# Patient Record
Sex: Female | Born: 1980 | Race: White | Hispanic: No | Marital: Married | Smoking: Former smoker
Health system: Southern US, Community
[De-identification: ages and names within clinical notes are randomized; demographics above are authoritative.]

## PROBLEM LIST (undated history)

## (undated) DIAGNOSIS — D509 Iron deficiency anemia, unspecified: Secondary | ICD-10-CM

## (undated) DIAGNOSIS — M7500 Adhesive capsulitis of unspecified shoulder: Secondary | ICD-10-CM

## (undated) DIAGNOSIS — J45909 Unspecified asthma, uncomplicated: Secondary | ICD-10-CM

## (undated) DIAGNOSIS — Z8669 Personal history of other diseases of the nervous system and sense organs: Secondary | ICD-10-CM

## (undated) DIAGNOSIS — M7540 Impingement syndrome of unspecified shoulder: Secondary | ICD-10-CM

## (undated) DIAGNOSIS — M25511 Pain in right shoulder: Secondary | ICD-10-CM

## (undated) DIAGNOSIS — T7840XA Allergy, unspecified, initial encounter: Secondary | ICD-10-CM

## (undated) DIAGNOSIS — O4590 Premature separation of placenta, unspecified, unspecified trimester: Secondary | ICD-10-CM

## (undated) DIAGNOSIS — Z789 Other specified health status: Secondary | ICD-10-CM

## (undated) DIAGNOSIS — M84353A Stress fracture, unspecified femur, initial encounter for fracture: Secondary | ICD-10-CM

## (undated) DIAGNOSIS — G540 Brachial plexus disorders: Secondary | ICD-10-CM

## (undated) DIAGNOSIS — N200 Calculus of kidney: Secondary | ICD-10-CM

## (undated) DIAGNOSIS — R011 Cardiac murmur, unspecified: Secondary | ICD-10-CM

## (undated) HISTORY — DX: Unspecified asthma, uncomplicated: J45.909

## (undated) HISTORY — DX: Allergy, unspecified, initial encounter: T78.40XA

## (undated) HISTORY — DX: Other specified health status: Z78.9

## (undated) HISTORY — DX: Brachial plexus disorders: G54.0

## (undated) HISTORY — DX: Iron deficiency anemia, unspecified: D50.9

## (undated) HISTORY — DX: Pain in right shoulder: M25.511

## (undated) HISTORY — DX: Calculus of kidney: N20.0

## (undated) HISTORY — DX: Impingement syndrome of unspecified shoulder: M75.40

## (undated) HISTORY — DX: Stress fracture, unspecified femur, initial encounter for fracture: M84.353A

## (undated) HISTORY — PX: URETERAL STENT PLACEMENT: SHX822

## (undated) HISTORY — DX: Personal history of other diseases of the nervous system and sense organs: Z86.69

## (undated) HISTORY — DX: Premature separation of placenta, unspecified, unspecified trimester: O45.90

## (undated) HISTORY — DX: Cardiac murmur, unspecified: R01.1

## (undated) HISTORY — DX: Adhesive capsulitis of unspecified shoulder: M75.00

## (undated) HISTORY — DX: Hypercalcemia: E83.52

## (undated) HISTORY — PX: SHOULDER ARTHROSCOPY: SHX128

## (undated) HISTORY — PX: NEPHROSTOMY: SHX1014

---

## 2005-02-08 ENCOUNTER — Ambulatory Visit (HOSPITAL_COMMUNITY): Admission: RE | Admit: 2005-02-08 | Discharge: 2005-02-08 | Payer: Self-pay | Admitting: Obstetrics and Gynecology

## 2005-04-18 ENCOUNTER — Emergency Department (HOSPITAL_COMMUNITY): Admission: EM | Admit: 2005-04-18 | Discharge: 2005-04-18 | Payer: Self-pay | Admitting: Emergency Medicine

## 2005-07-18 ENCOUNTER — Ambulatory Visit: Payer: Self-pay | Admitting: Family Medicine

## 2005-07-29 ENCOUNTER — Other Ambulatory Visit: Admission: RE | Admit: 2005-07-29 | Discharge: 2005-07-29 | Payer: Self-pay | Admitting: Obstetrics and Gynecology

## 2005-07-30 ENCOUNTER — Encounter (INDEPENDENT_AMBULATORY_CARE_PROVIDER_SITE_OTHER): Payer: Self-pay | Admitting: Internal Medicine

## 2005-08-05 ENCOUNTER — Ambulatory Visit (HOSPITAL_COMMUNITY): Admission: RE | Admit: 2005-08-05 | Discharge: 2005-08-05 | Payer: Self-pay | Admitting: Obstetrics and Gynecology

## 2005-08-26 ENCOUNTER — Inpatient Hospital Stay (HOSPITAL_COMMUNITY): Admission: AD | Admit: 2005-08-26 | Discharge: 2005-08-26 | Payer: Self-pay | Admitting: Obstetrics and Gynecology

## 2005-09-04 ENCOUNTER — Ambulatory Visit: Payer: Self-pay | Admitting: Family Medicine

## 2005-09-09 ENCOUNTER — Ambulatory Visit (HOSPITAL_COMMUNITY): Admission: RE | Admit: 2005-09-09 | Discharge: 2005-09-09 | Payer: Self-pay | Admitting: Obstetrics and Gynecology

## 2005-09-20 ENCOUNTER — Ambulatory Visit: Payer: Self-pay | Admitting: Family Medicine

## 2005-10-02 ENCOUNTER — Inpatient Hospital Stay (HOSPITAL_COMMUNITY): Admission: AD | Admit: 2005-10-02 | Discharge: 2005-10-05 | Payer: Self-pay | Admitting: Obstetrics and Gynecology

## 2005-10-11 ENCOUNTER — Ambulatory Visit: Payer: Self-pay | Admitting: Family Medicine

## 2005-10-19 ENCOUNTER — Inpatient Hospital Stay (HOSPITAL_COMMUNITY): Admission: AD | Admit: 2005-10-19 | Discharge: 2005-10-19 | Payer: Self-pay | Admitting: Obstetrics and Gynecology

## 2005-10-22 ENCOUNTER — Inpatient Hospital Stay (HOSPITAL_COMMUNITY): Admission: AD | Admit: 2005-10-22 | Discharge: 2005-10-24 | Payer: Self-pay | Admitting: Obstetrics and Gynecology

## 2005-10-22 ENCOUNTER — Ambulatory Visit (HOSPITAL_COMMUNITY): Admission: RE | Admit: 2005-10-22 | Discharge: 2005-10-22 | Payer: Self-pay | Admitting: *Deleted

## 2005-10-22 ENCOUNTER — Ambulatory Visit: Payer: Self-pay | Admitting: *Deleted

## 2005-10-31 ENCOUNTER — Encounter (HOSPITAL_COMMUNITY): Admission: AD | Admit: 2005-10-31 | Discharge: 2005-10-31 | Payer: Self-pay | Admitting: Obstetrics and Gynecology

## 2005-11-06 ENCOUNTER — Inpatient Hospital Stay (HOSPITAL_COMMUNITY): Admission: AD | Admit: 2005-11-06 | Discharge: 2005-11-06 | Payer: Self-pay | Admitting: Obstetrics and Gynecology

## 2005-11-06 ENCOUNTER — Ambulatory Visit: Payer: Self-pay | Admitting: Neonatology

## 2005-11-06 ENCOUNTER — Inpatient Hospital Stay (HOSPITAL_COMMUNITY): Admission: AD | Admit: 2005-11-06 | Discharge: 2005-11-15 | Payer: Self-pay | Admitting: Obstetrics and Gynecology

## 2005-11-06 ENCOUNTER — Ambulatory Visit: Payer: Self-pay | Admitting: *Deleted

## 2005-11-12 ENCOUNTER — Encounter (INDEPENDENT_AMBULATORY_CARE_PROVIDER_SITE_OTHER): Payer: Self-pay | Admitting: *Deleted

## 2005-11-16 ENCOUNTER — Encounter: Admission: RE | Admit: 2005-11-16 | Discharge: 2005-11-19 | Payer: Self-pay | Admitting: Obstetrics and Gynecology

## 2005-11-20 ENCOUNTER — Inpatient Hospital Stay (HOSPITAL_COMMUNITY): Admission: AD | Admit: 2005-11-20 | Discharge: 2005-11-20 | Payer: Self-pay | Admitting: Obstetrics and Gynecology

## 2005-11-21 ENCOUNTER — Inpatient Hospital Stay (HOSPITAL_COMMUNITY): Admission: AD | Admit: 2005-11-21 | Discharge: 2005-11-21 | Payer: Self-pay | Admitting: Obstetrics and Gynecology

## 2005-11-22 ENCOUNTER — Encounter (HOSPITAL_COMMUNITY): Admission: AD | Admit: 2005-11-22 | Discharge: 2005-12-22 | Payer: Self-pay | Admitting: Obstetrics and Gynecology

## 2005-12-12 ENCOUNTER — Ambulatory Visit: Payer: Self-pay | Admitting: Family Medicine

## 2006-01-01 ENCOUNTER — Ambulatory Visit: Payer: Self-pay | Admitting: Family Medicine

## 2006-02-19 ENCOUNTER — Ambulatory Visit: Payer: Self-pay | Admitting: Family Medicine

## 2006-02-27 ENCOUNTER — Ambulatory Visit: Payer: Self-pay | Admitting: Family Medicine

## 2006-03-22 ENCOUNTER — Emergency Department (HOSPITAL_COMMUNITY): Admission: EM | Admit: 2006-03-22 | Discharge: 2006-03-22 | Payer: Self-pay | Admitting: Emergency Medicine

## 2006-04-23 ENCOUNTER — Ambulatory Visit: Payer: Self-pay | Admitting: Internal Medicine

## 2006-04-25 ENCOUNTER — Encounter (INDEPENDENT_AMBULATORY_CARE_PROVIDER_SITE_OTHER): Payer: Self-pay | Admitting: Internal Medicine

## 2006-04-25 LAB — CONVERTED CEMR LAB
RBC count: 4.25 10*6/uL
WBC, blood: 5 10*3/uL

## 2006-06-11 ENCOUNTER — Ambulatory Visit: Payer: Self-pay | Admitting: Internal Medicine

## 2006-08-13 ENCOUNTER — Other Ambulatory Visit: Admission: RE | Admit: 2006-08-13 | Discharge: 2006-08-13 | Payer: Self-pay | Admitting: Obstetrics and Gynecology

## 2006-08-15 ENCOUNTER — Ambulatory Visit: Payer: Self-pay | Admitting: Internal Medicine

## 2006-08-18 ENCOUNTER — Inpatient Hospital Stay (HOSPITAL_COMMUNITY): Admission: AD | Admit: 2006-08-18 | Discharge: 2006-08-18 | Payer: Self-pay | Admitting: Obstetrics and Gynecology

## 2006-10-03 ENCOUNTER — Ambulatory Visit: Payer: Self-pay | Admitting: Family Medicine

## 2006-10-31 ENCOUNTER — Ambulatory Visit: Payer: Self-pay | Admitting: Family Medicine

## 2006-11-08 ENCOUNTER — Inpatient Hospital Stay (HOSPITAL_COMMUNITY): Admission: AD | Admit: 2006-11-08 | Discharge: 2006-11-08 | Payer: Self-pay | Admitting: Obstetrics and Gynecology

## 2006-12-03 ENCOUNTER — Inpatient Hospital Stay (HOSPITAL_COMMUNITY): Admission: AD | Admit: 2006-12-03 | Discharge: 2006-12-03 | Payer: Self-pay | Admitting: Obstetrics and Gynecology

## 2006-12-04 ENCOUNTER — Encounter: Payer: Self-pay | Admitting: Internal Medicine

## 2006-12-04 DIAGNOSIS — J45909 Unspecified asthma, uncomplicated: Secondary | ICD-10-CM | POA: Insufficient documentation

## 2006-12-04 DIAGNOSIS — E785 Hyperlipidemia, unspecified: Secondary | ICD-10-CM | POA: Insufficient documentation

## 2006-12-04 DIAGNOSIS — G43909 Migraine, unspecified, not intractable, without status migrainosus: Secondary | ICD-10-CM | POA: Insufficient documentation

## 2006-12-04 DIAGNOSIS — O4590 Premature separation of placenta, unspecified, unspecified trimester: Secondary | ICD-10-CM | POA: Insufficient documentation

## 2006-12-04 DIAGNOSIS — J309 Allergic rhinitis, unspecified: Secondary | ICD-10-CM | POA: Insufficient documentation

## 2006-12-04 DIAGNOSIS — D649 Anemia, unspecified: Secondary | ICD-10-CM | POA: Insufficient documentation

## 2006-12-10 ENCOUNTER — Ambulatory Visit (HOSPITAL_COMMUNITY): Admission: RE | Admit: 2006-12-10 | Discharge: 2006-12-10 | Payer: Self-pay | Admitting: Obstetrics and Gynecology

## 2006-12-10 ENCOUNTER — Encounter (INDEPENDENT_AMBULATORY_CARE_PROVIDER_SITE_OTHER): Payer: Self-pay | Admitting: Internal Medicine

## 2006-12-11 ENCOUNTER — Inpatient Hospital Stay (HOSPITAL_COMMUNITY): Admission: AD | Admit: 2006-12-11 | Discharge: 2006-12-11 | Payer: Self-pay | Admitting: Obstetrics and Gynecology

## 2006-12-12 ENCOUNTER — Ambulatory Visit: Payer: Self-pay | Admitting: Family Medicine

## 2006-12-19 ENCOUNTER — Ambulatory Visit (HOSPITAL_COMMUNITY): Admission: RE | Admit: 2006-12-19 | Discharge: 2006-12-19 | Payer: Self-pay | Admitting: Urology

## 2006-12-29 ENCOUNTER — Ambulatory Visit (HOSPITAL_COMMUNITY): Admission: RE | Admit: 2006-12-29 | Discharge: 2006-12-29 | Payer: Self-pay | Admitting: Interventional Radiology

## 2007-01-15 ENCOUNTER — Ambulatory Visit (HOSPITAL_COMMUNITY): Admission: RE | Admit: 2007-01-15 | Discharge: 2007-01-15 | Payer: Self-pay | Admitting: Urology

## 2007-01-16 ENCOUNTER — Inpatient Hospital Stay (HOSPITAL_COMMUNITY): Admission: AD | Admit: 2007-01-16 | Discharge: 2007-01-16 | Payer: Self-pay | Admitting: Obstetrics and Gynecology

## 2007-01-17 ENCOUNTER — Inpatient Hospital Stay (HOSPITAL_COMMUNITY): Admission: AD | Admit: 2007-01-17 | Discharge: 2007-01-17 | Payer: Self-pay | Admitting: Obstetrics and Gynecology

## 2007-01-18 ENCOUNTER — Inpatient Hospital Stay (HOSPITAL_COMMUNITY): Admission: AD | Admit: 2007-01-18 | Discharge: 2007-01-18 | Payer: Self-pay | Admitting: Obstetrics and Gynecology

## 2007-01-19 ENCOUNTER — Inpatient Hospital Stay (HOSPITAL_COMMUNITY): Admission: AD | Admit: 2007-01-19 | Discharge: 2007-01-19 | Payer: Self-pay | Admitting: Obstetrics and Gynecology

## 2007-01-20 ENCOUNTER — Inpatient Hospital Stay (HOSPITAL_COMMUNITY): Admission: AD | Admit: 2007-01-20 | Discharge: 2007-01-20 | Payer: Self-pay | Admitting: Obstetrics and Gynecology

## 2007-01-21 ENCOUNTER — Inpatient Hospital Stay (HOSPITAL_COMMUNITY): Admission: AD | Admit: 2007-01-21 | Discharge: 2007-01-21 | Payer: Self-pay | Admitting: Obstetrics and Gynecology

## 2007-01-27 ENCOUNTER — Encounter: Payer: Self-pay | Admitting: Obstetrics and Gynecology

## 2007-01-27 ENCOUNTER — Ambulatory Visit (HOSPITAL_COMMUNITY): Admission: AD | Admit: 2007-01-27 | Discharge: 2007-01-28 | Payer: Self-pay | Admitting: Urology

## 2007-02-06 ENCOUNTER — Inpatient Hospital Stay (HOSPITAL_COMMUNITY): Admission: AD | Admit: 2007-02-06 | Discharge: 2007-02-09 | Payer: Self-pay | Admitting: Obstetrics and Gynecology

## 2007-02-16 ENCOUNTER — Inpatient Hospital Stay (HOSPITAL_COMMUNITY): Admission: AD | Admit: 2007-02-16 | Discharge: 2007-02-16 | Payer: Self-pay | Admitting: Obstetrics and Gynecology

## 2007-02-20 ENCOUNTER — Inpatient Hospital Stay (HOSPITAL_COMMUNITY): Admission: AD | Admit: 2007-02-20 | Discharge: 2007-02-22 | Payer: Self-pay | Admitting: Obstetrics and Gynecology

## 2007-02-20 ENCOUNTER — Encounter (INDEPENDENT_AMBULATORY_CARE_PROVIDER_SITE_OTHER): Payer: Self-pay | Admitting: Specialist

## 2007-04-21 ENCOUNTER — Encounter: Admission: RE | Admit: 2007-04-21 | Discharge: 2007-07-20 | Payer: Self-pay | Admitting: Obstetrics and Gynecology

## 2007-04-22 ENCOUNTER — Telehealth (INDEPENDENT_AMBULATORY_CARE_PROVIDER_SITE_OTHER): Payer: Self-pay | Admitting: Family Medicine

## 2007-05-14 IMAGING — XA IR BILIARY CATHETER EXCHANGE
1 series · 5 of 5 positions shown · IV contrast (omnipaque)
Comparison: none

CLINICAL DATA: The patient is status post right percutaneous nephrostomy for ureteral obstruction due to a renal calculus.  She is now approximately 32 weeks pregnant, and the tube was originally placed on 12/19/06.  The 10-French nephrostomy tube had to be replaced on 12/29/06 due to tube occlusion from urinary sediment and debris.  The tube has now stopped draining sufficiently and is also difficult to flush.  The patient now presents for tube check with possible exchange. 
 RIGHT PERCUTANEOUS NEPHROSTOMY TUBE PLACEMENT WITH INJECTION OF CONTRAST UNDER FLUOROSCOPY ? 01/15/07:
 Contrast:  15 cc diluted Omnipaque 300. 
 Total fluoro time:  4.2 minutes.
 No sedation was administered.  Informed consent was obtained.  The entire pelvis was shielded during the procedure. 
 Initially, the preexisting 10-French nephrostomy tube was sterilely prepped and draped.  This was injected with contrast material and fluoroscopic spot images obtained.  The tube was then reprepped and sterilely draped.  Retention suture was cut and a guidewire advanced through the catheter after the external catheter was cut.  Ultimately, different guidewires were utilized in obtaining access out of the tube into the renal pelvis.  The preexisting tube was then removed over a guidewire.
 A new 12-French percutaneous nephrostomy tube was then advanced over a guidewire and formed.  This was then utilized in collecting a urine specimen for culture.  The tube was flushed and connected to a gravity bag.  The external tube was then secured to the skin with a Prolene retention suture and adhesive retention device. 
 Complications:  None.

[Series 1: run · 5 of 5 slices shown]
[im 1/5]
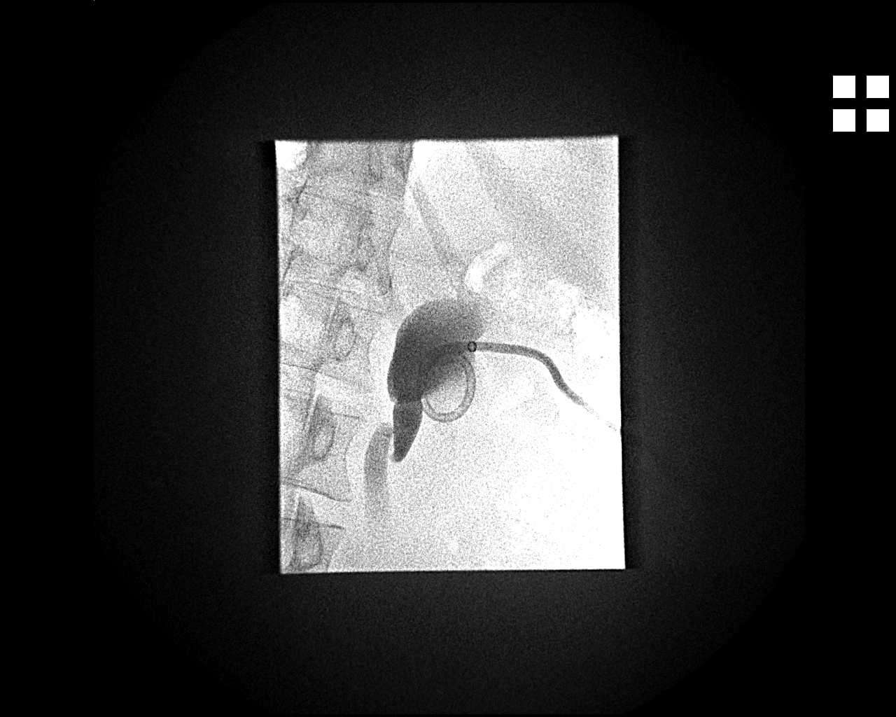
[im 2/5]
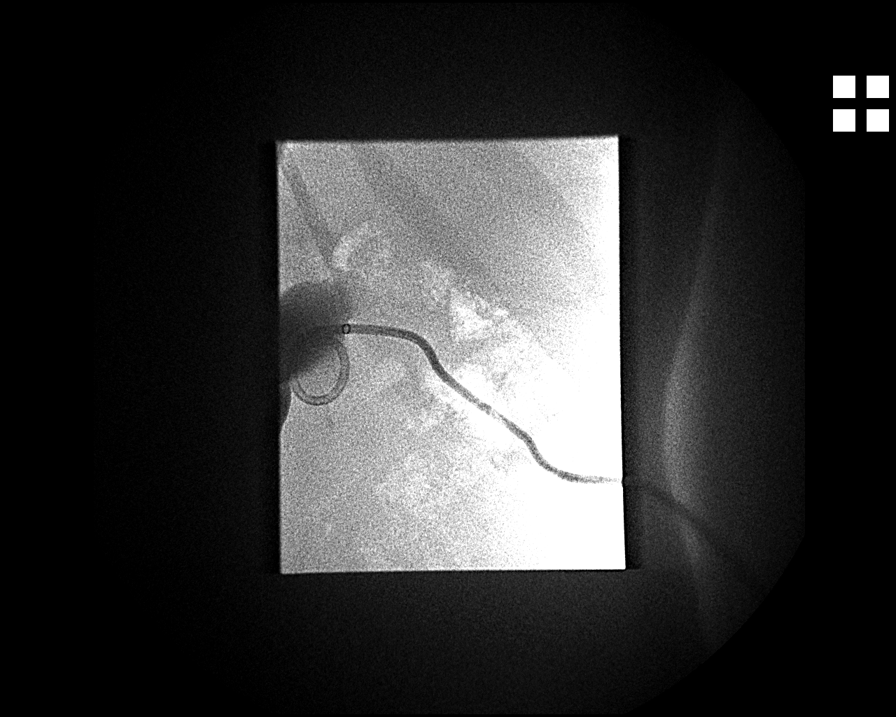
[im 3/5]
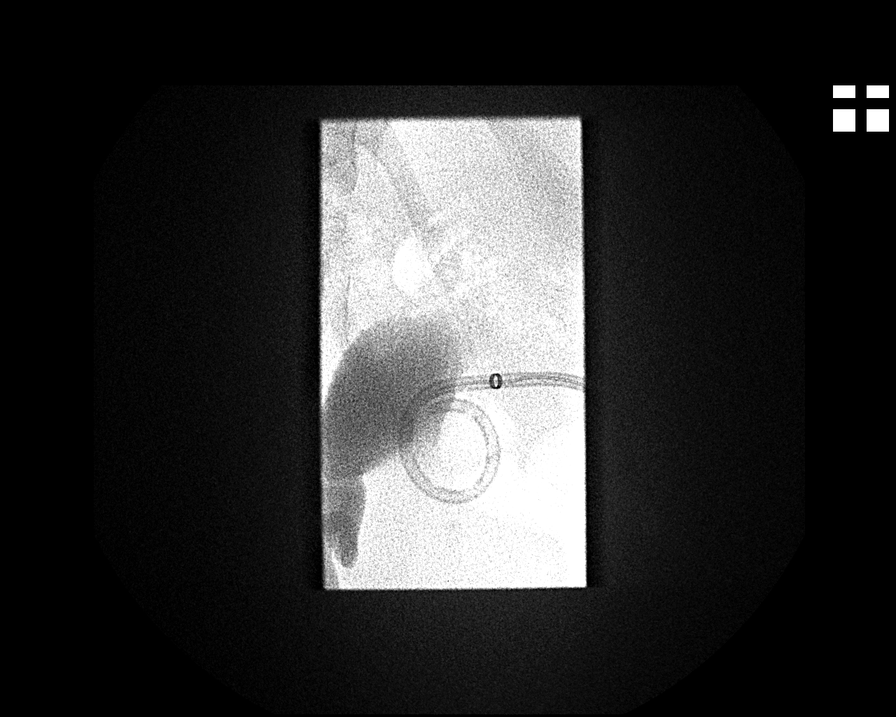
[im 4/5]
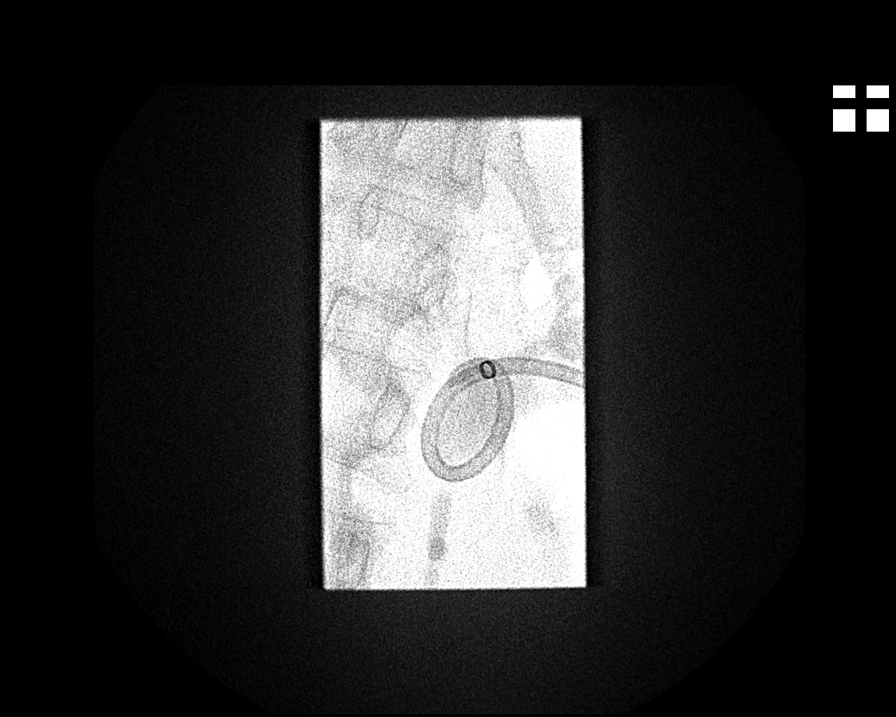
[im 5/5]
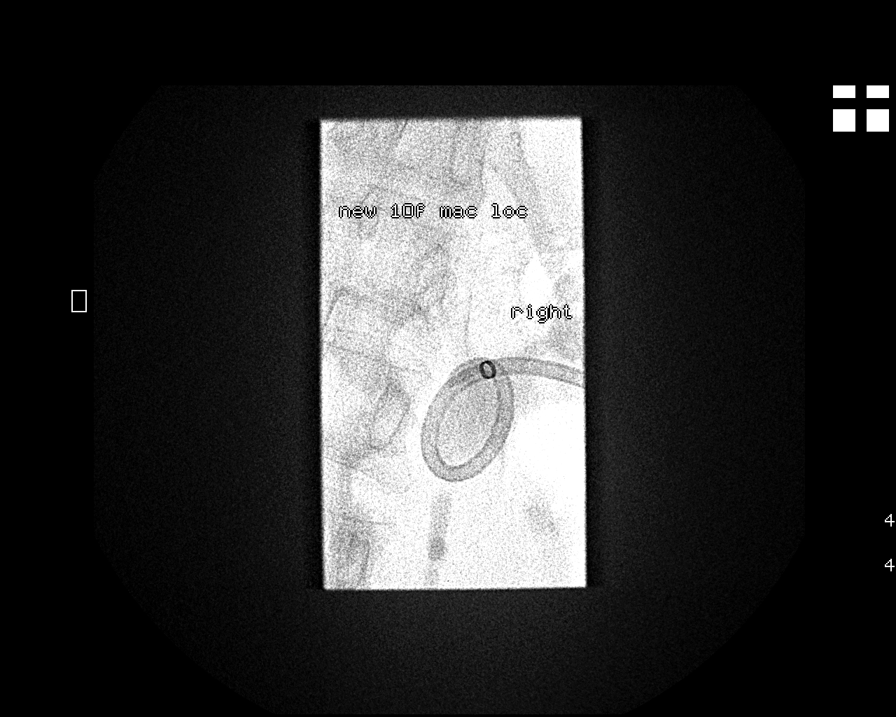

[5 of 5 positions shown; findings below may reference images not displayed]

FINDINGS: Initial injection of the right 10-French nephrostomy tube shows partial obstruction of most of the distal portion of the tube with contrast exiting one or a few proximal side holes and entering the renal pelvis.  When a guidewire was advanced into the catheter, a significant amount of resistance was met in the pigtail portion due to tube occlusion.  It was difficult in ultimately passing the guidewire out of one of the side holes and into the renal pelvis.  Ultimately, this was able to be accomplished and the tube removed over a wire, avoiding re-puncture of the kidney.  Due to the rapid development of tube occlusion and buildup of urinary sediment, the tube was upsized to a 12-French catheter.  This was formed at the level of the renal pelvis.  A sample of urine was sent for culture, as the patient has had some low-grade fevers at home over the last week.
IMPRESSION: Occlusion of most of the distal portion of the preexisting 10-French nephrostomy tube, which is well positioned in the central collecting system.  This is due to buildup of urinary sediment and debris in the catheter.  After a fairly difficult removal of the partially occluded tube over a guidewire, a new nephrostomy tube was placed.  Tube size was increased to 12-French, and the catheter was formed at the level of the renal pelvis.  This will be left to gravity drainage, and the patient will flush the catheter at least once daily at home.  As above, a sample of urine was sent for culture study as the patient has had low-grade fevers at home.  The urine itself was slightly blood-tinged upon completion of the procedure and not grossly infected in appearance.

## 2007-05-15 IMAGING — CR DG CHEST 2V
2 series · 2 of 2 positions shown · non-contrast
Comparison: none

CLINICAL DATA: 31 weeks estimated gestational age with recent placement of nephrostomy tube.  Chest pain increasing when supine.  Evaluate for pneumothorax.        
 CHEST - 2 VIEW:

[view not recorded (1 of 2)]
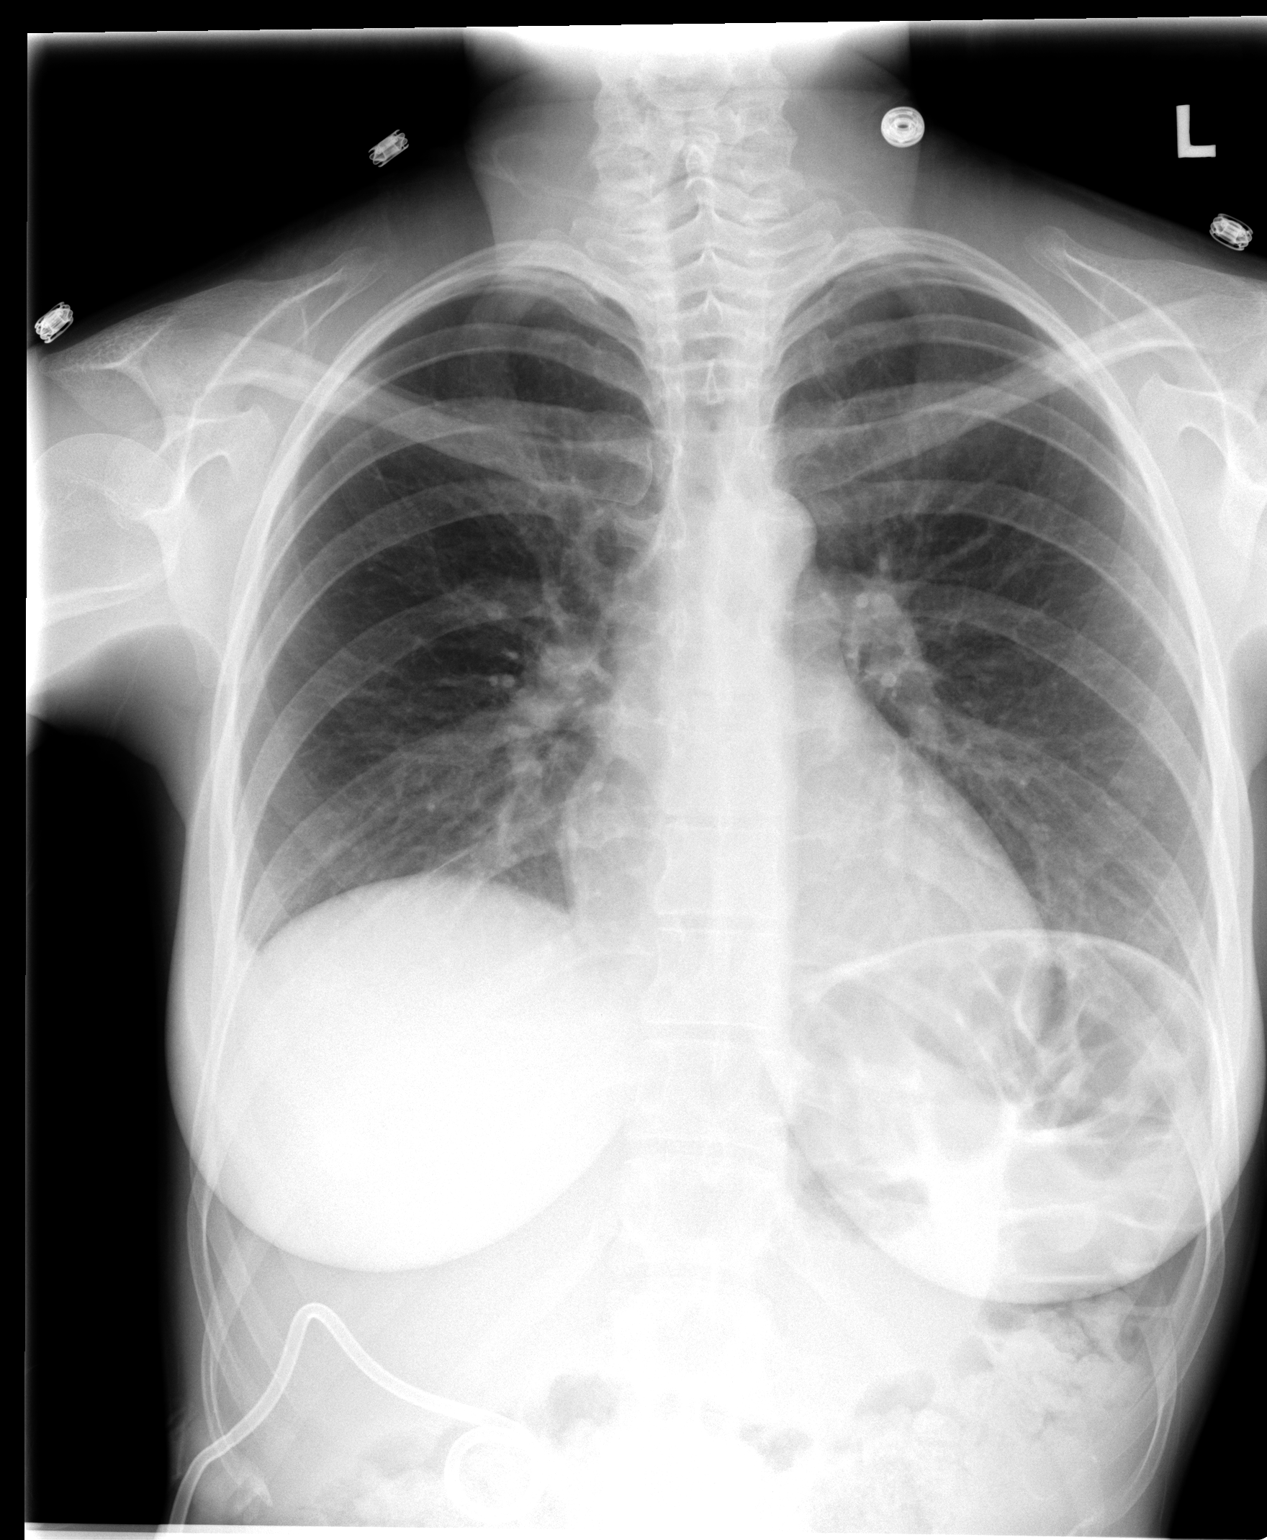

[view not recorded (2 of 2)]
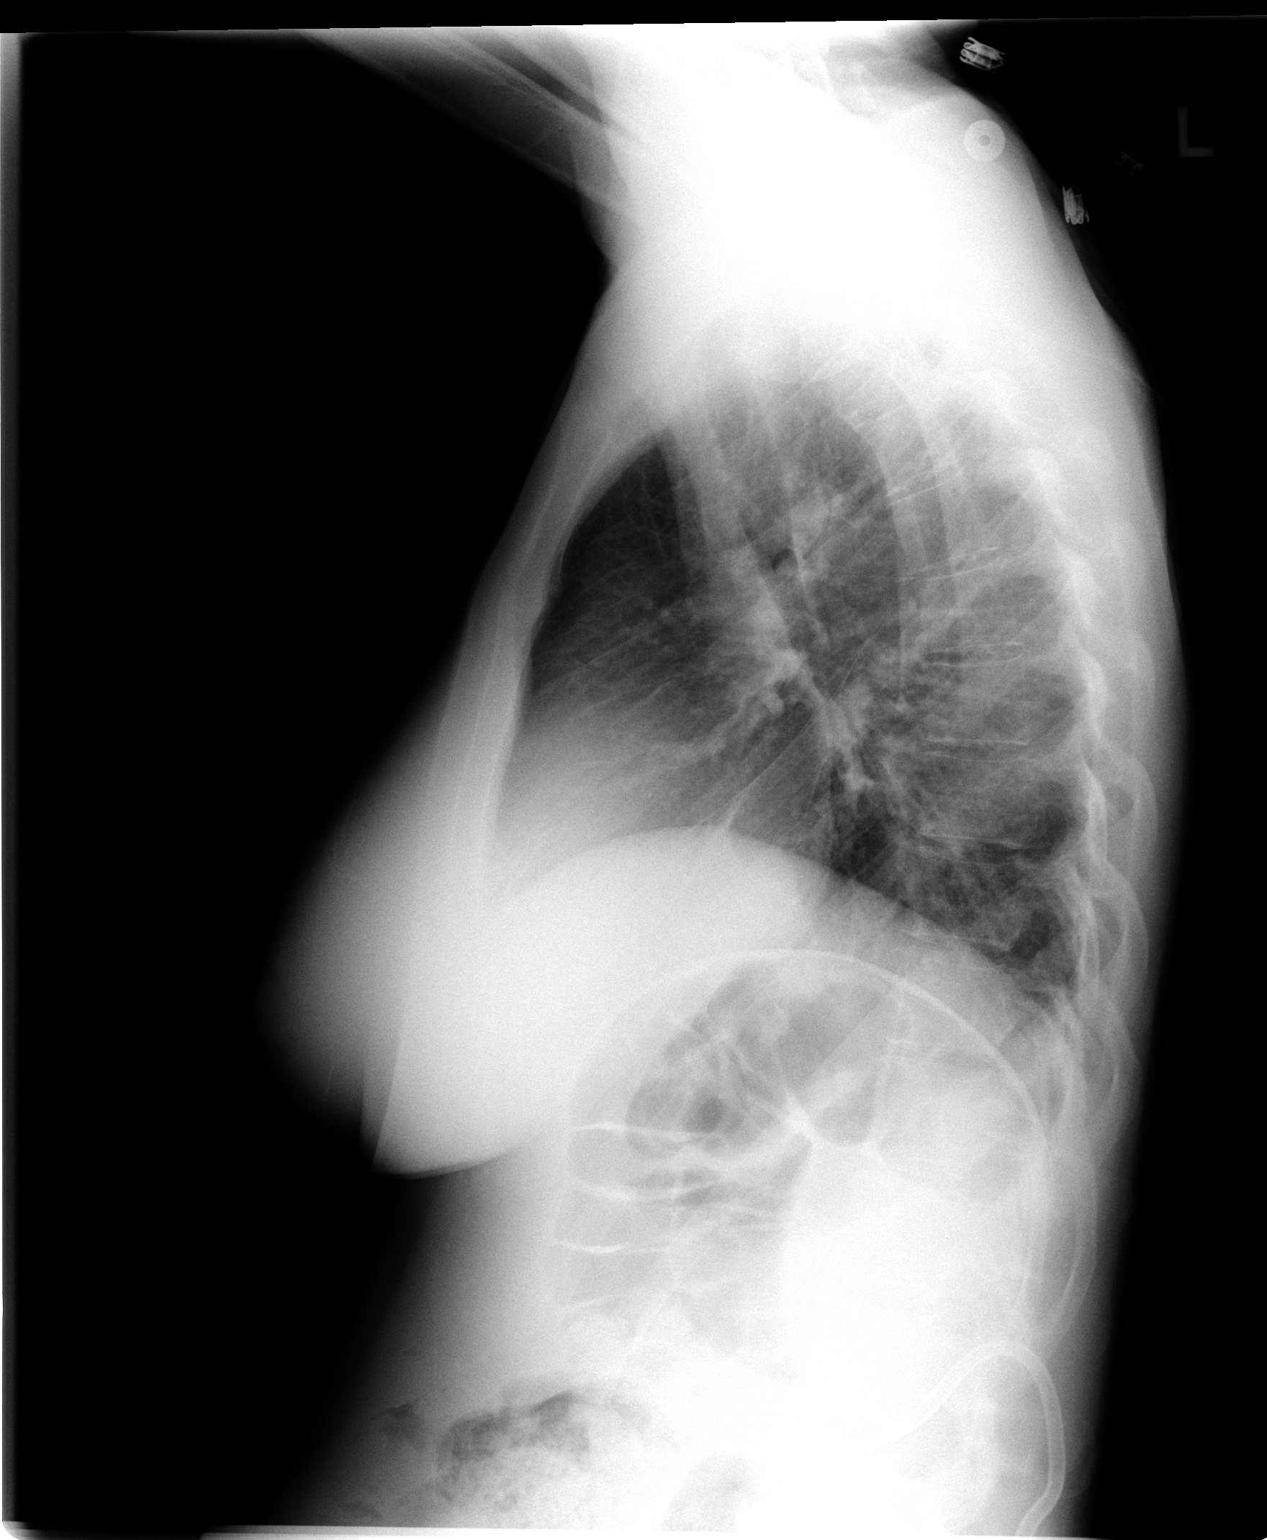

[2 of 2 positions shown; findings below may reference images not displayed]

FINDINGS: Heart and mediastinal contours are within normal limits.  The lungs fields are clear with no signs of pneumothorax.  Small amount of blunting of the right costophrenic angle is seen suggestive of a small amount of pleural fluid on this side and this may be post-inflammatory from the patient?s recent nephrostomy tube placement on the right.  Bony structures appear intact.
IMPRESSION: Findings suspicious for a small right pleural effusion. Otherwise, unremarkable.

## 2007-05-16 IMAGING — US US FETAL BPP W/O NONSTRESS
1 series · 8 of 8 positions shown · non-contrast
Comparison: 08/18/2006.

CLINICAL DATA: Kidney infection. 31 weeks and 6 days pregnant. Variable fetal
cardiac decelerations.

ULTRASOUND FETAL BIOPHYSICAL PROFILE WITHOUT NONSTRESS

[Series 1: us fetal bpp w/o nonstress · 0.33mm/px · 8 of 8 slices shown]
[im 1/8]
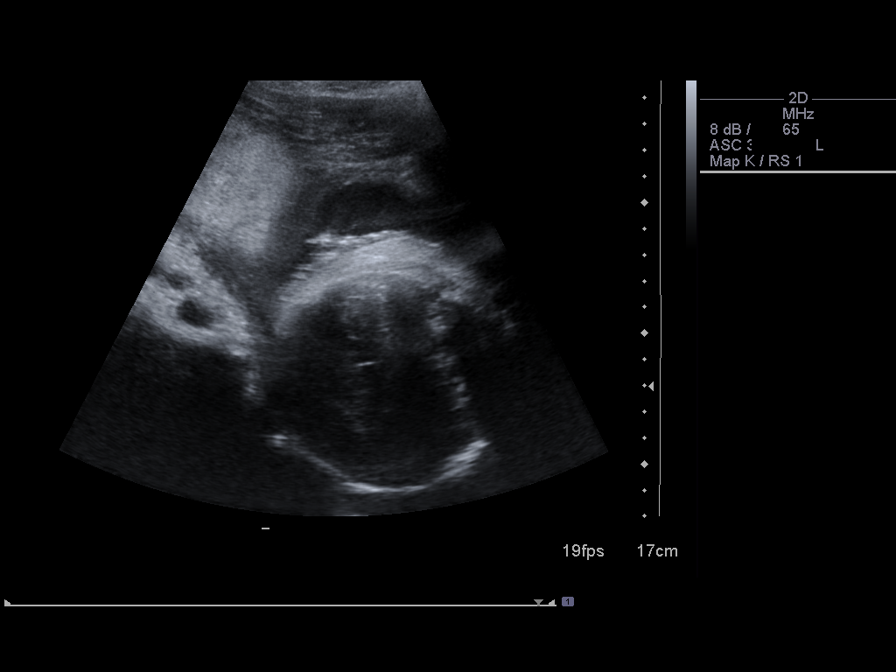
[im 2/8]
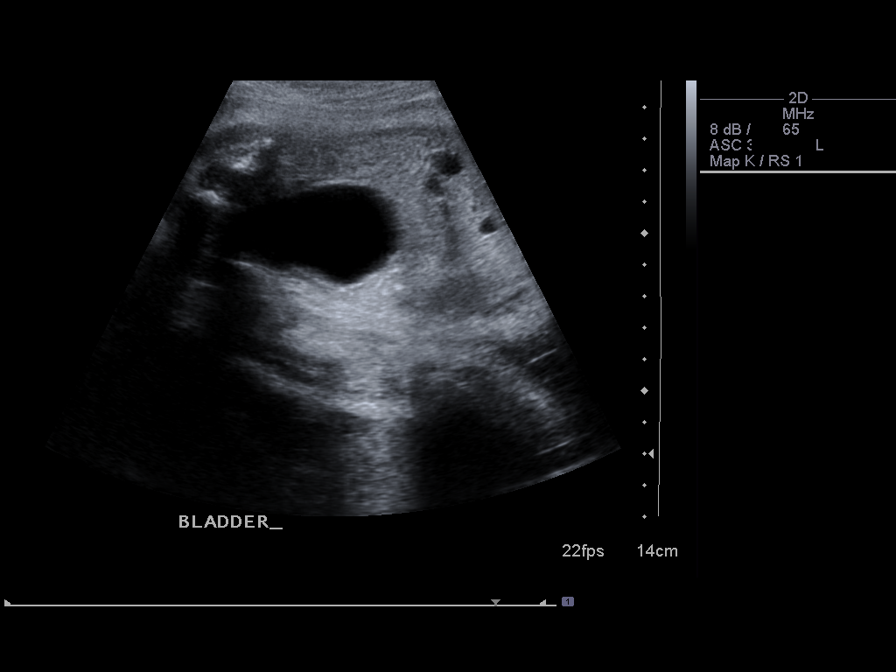
[im 3/8]
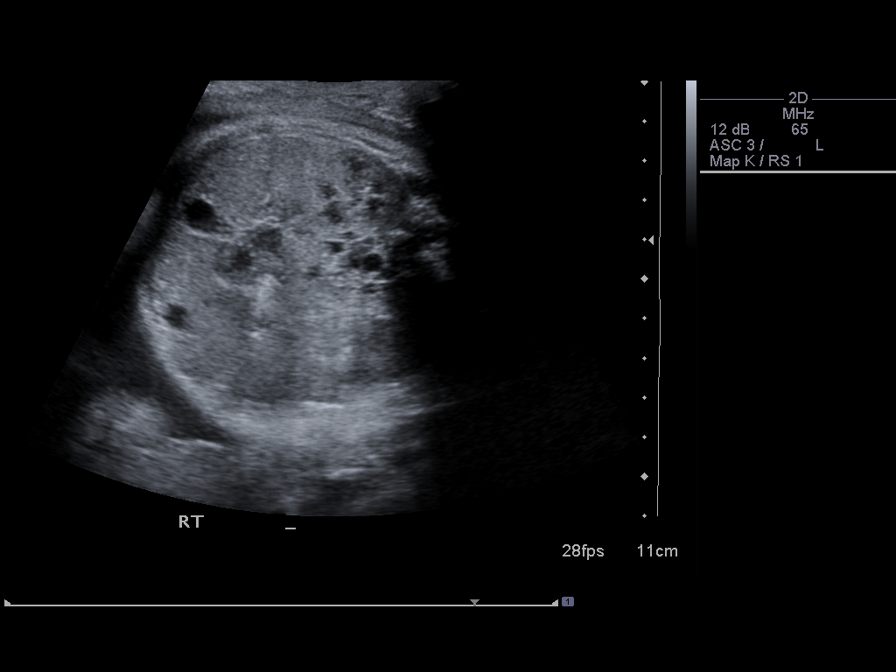
[im 4/8]
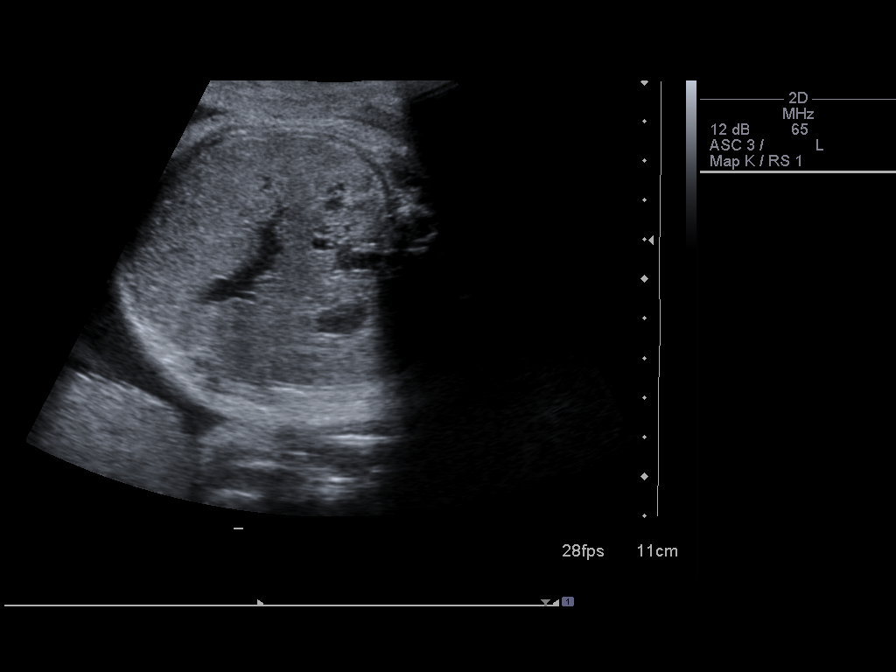
[im 5/8]
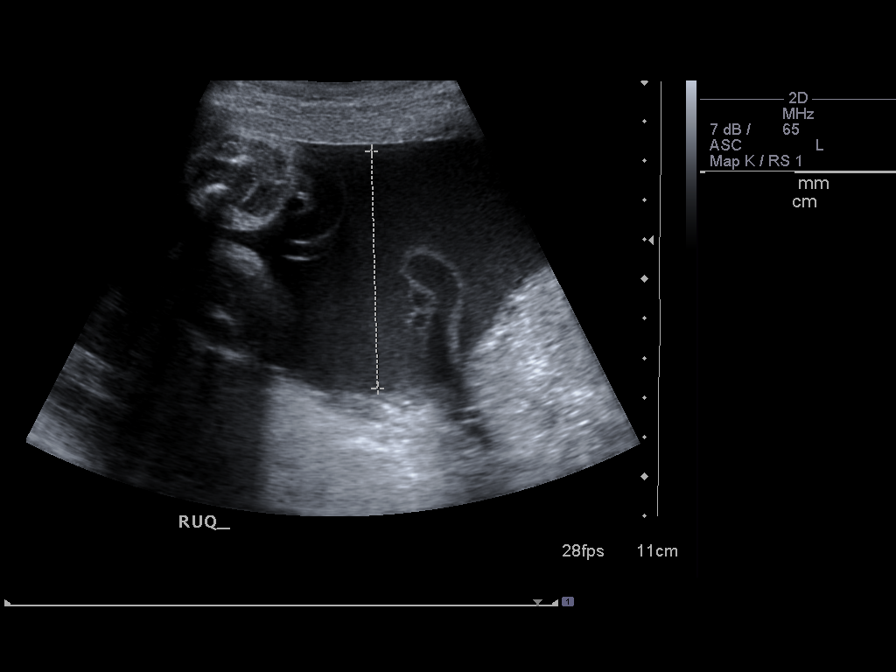
[im 6/8]
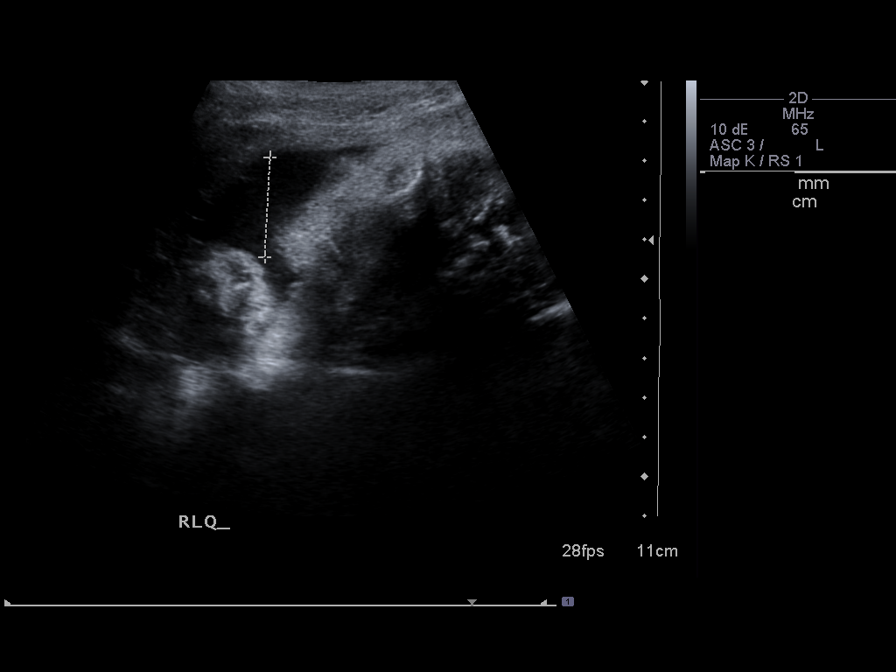
[im 7/8]
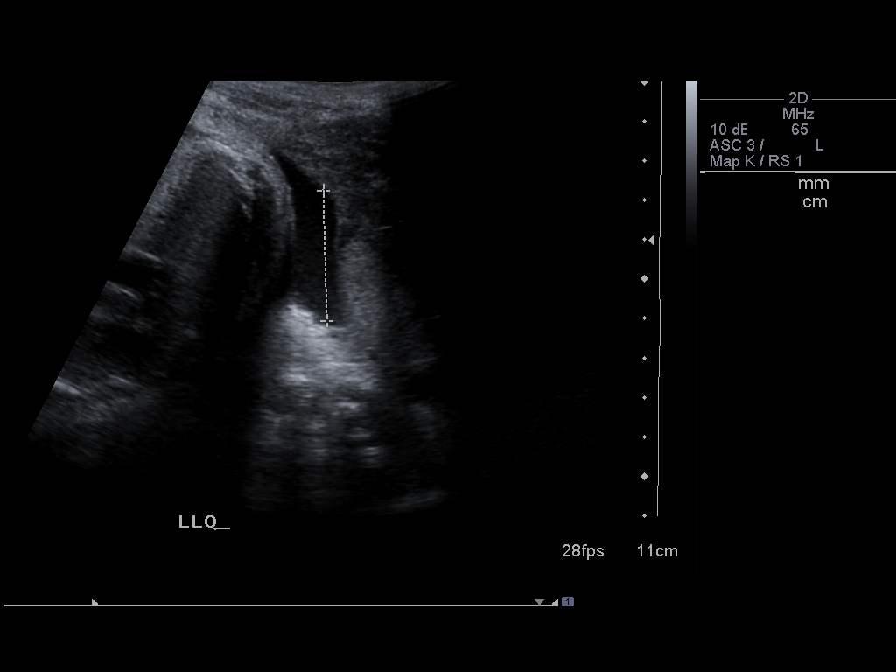
[im 8/8]
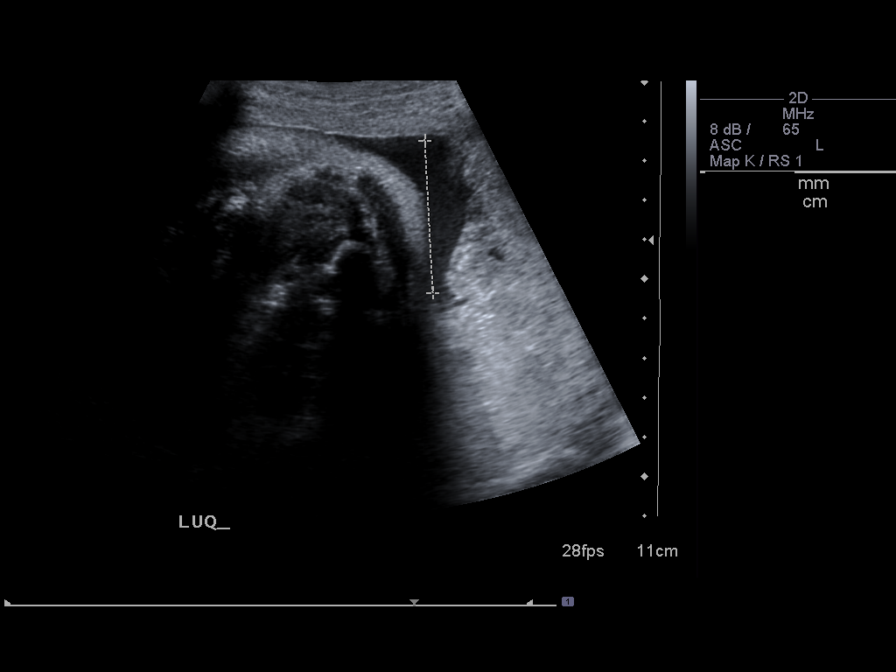

[8 of 8 positions shown; findings below may reference images not displayed]

FINDINGS: Single intrauterine gestation in a cephalic presentation. Normal
fetal cardiac, limb and diaphragmatic movement with a fetal heart rate of 141
beats per minute. Posterior placenta without previa. Normal amniotic fluid
volume with an amniotic fluid index of 15.7 cm. No detailed evaluation of the
fetal anatomy was requested or performed. A left-sided fetal stomach, normal
appearing fetal right kidney and normal appearing fetal urinary bladder were
noted.

The fetal biophysical profile score is [DATE] within 30 minutes. A score of 0 was
signed for lack of sustained breathing motion.

IMPRESSION

1. Single live intrauterine gestation in a cephalic presentation.
2. Fetal biophysical profile score of [DATE].

## 2007-08-25 ENCOUNTER — Ambulatory Visit: Admission: RE | Admit: 2007-08-25 | Discharge: 2007-08-25 | Payer: Self-pay | Admitting: Obstetrics and Gynecology

## 2008-02-06 ENCOUNTER — Emergency Department (HOSPITAL_COMMUNITY): Admission: EM | Admit: 2008-02-06 | Discharge: 2008-02-06 | Payer: Self-pay | Admitting: Emergency Medicine

## 2008-05-26 ENCOUNTER — Ambulatory Visit: Payer: Self-pay | Admitting: Internal Medicine

## 2008-05-26 DIAGNOSIS — J069 Acute upper respiratory infection, unspecified: Secondary | ICD-10-CM | POA: Insufficient documentation

## 2008-07-12 ENCOUNTER — Ambulatory Visit: Payer: Self-pay | Admitting: Internal Medicine

## 2008-07-14 ENCOUNTER — Ambulatory Visit (HOSPITAL_BASED_OUTPATIENT_CLINIC_OR_DEPARTMENT_OTHER): Admission: RE | Admit: 2008-07-14 | Discharge: 2008-07-14 | Payer: Self-pay | Admitting: General Surgery

## 2008-07-14 ENCOUNTER — Encounter (INDEPENDENT_AMBULATORY_CARE_PROVIDER_SITE_OTHER): Payer: Self-pay | Admitting: General Surgery

## 2010-06-17 ENCOUNTER — Emergency Department (HOSPITAL_COMMUNITY): Admission: EM | Admit: 2010-06-17 | Discharge: 2010-06-17 | Payer: Self-pay | Admitting: Emergency Medicine

## 2011-01-19 ENCOUNTER — Encounter: Payer: Self-pay | Admitting: Obstetrics and Gynecology

## 2011-01-20 ENCOUNTER — Encounter: Payer: Self-pay | Admitting: Urology

## 2011-03-27 ENCOUNTER — Ambulatory Visit
Admission: RE | Admit: 2011-03-27 | Discharge: 2011-03-27 | Disposition: A | Discharge status: Home / Self Care | Payer: Managed Care, Other (non HMO) | Source: Ambulatory Visit | Attending: Orthopedic Surgery | Admitting: Orthopedic Surgery

## 2011-03-27 ENCOUNTER — Other Ambulatory Visit: Payer: Self-pay | Admitting: Orthopedic Surgery

## 2011-03-27 DIAGNOSIS — R531 Weakness: Secondary | ICD-10-CM

## 2011-03-27 DIAGNOSIS — R52 Pain, unspecified: Secondary | ICD-10-CM

## 2011-05-14 NOTE — Op Note (Signed)
[name], [name]               ACCOUNT NO.:  [id number]   MEDICAL RECORD NO.:  [id number]          PATIENT TYPE:  AMB   LOCATION:  DSC                          FACILITY:  [facility]   PHYSICIAN:  [name], MDDATE OF BIRTH:  [birth year]   DATE OF PROCEDURE:  07/14/2008  DATE OF DISCHARGE:                               OPERATIVE REPORT   PREOPERATIVE DIAGNOSIS:  Left thigh mass.   POSTOPERATIVE DIAGNOSIS:  Left thigh mass.   PROCEDURE PERFORMED:  Left thigh mass excisional biopsy.   SURGEON:  Emelia Loron, MD   ASSISTANT:  None.   ANESTHESIA:  Local with MAC by Pearlie Oyster, CRNA   SPECIMEN:  Left thigh mass, to Pathology.   DRAINS:  None.   COMPLICATIONS:  None.   ESTIMATED BLOOD LOSS:  Minimal.   FLUIDS:  Minimal.   INDICATIONS:  This is a 30 year old female who has noted a hard left  thigh mass that has been present for a number of months that is  concerning to her, with plans for an excisional biopsy.   PROCEDURE:  After informed consent was obtained, the patient was taken  to the operating room, where she was placed under IV sedation by  Anesthesia.  Her left thigh was then prepped and draped in the standard  sterile surgical fashion, and the area was infiltrated with 1%lidocaine  and 0.25% Marcaine, 50:50 mixture, without epinephrine.  An  approximately 1.5-cm incision was then made.  Dissection was carried out  down to the region of the mass, which was then excised sharply.  This  was then sent off the table as a specimen.  The deep layer was then  closed with a 4-0 Vicryl suture and then the skin was closed with 4-0  nylon interrupted sutures.  Hemostasis was observed.  A sterile dressing  was applied.  She tolerated this well and was transferred to the PACU in  stable condition.     [name], MD  Electronically Signed    MCW/MEDQ  D:  07/14/2008  T:  07/15/2008  Job:  [id number]

## 2011-05-17 NOTE — H&P (Signed)
NAMESAHER, DAVEE               ACCOUNT NO.:  [id number]   MEDICAL RECORD NO.:  [id number]          PATIENT TYPE:  INP   LOCATION:  9169                          [facility]ILITY[facility]   PHYSICIAN:  Hal Morales, M.D.DATE OF BIRTH:  [birth year]   DATE OF ADMISSION:  02/20/2007  DATE OF DISCHARGE:                              HISTORY & PHYSICAL   This is a 30 year old gravida 2, para 0-1-0-0 at 36-5/7 weeks who  presents with contractions since midnight.  She denies leaking or  bleeding and reports positive fetal movement.  She has opted for trial  of labor to attempt a vaginal birth this pregnancy.  Pregnancy has been  followed by the physician service and is remarkable for:  1. Asthma.  2. History of migraines.  3. Family history of autism.  4. History of second trimester loss.  5. Previous C. section.  6. Group B strep unknown.  7. Urolithiasis with pyelonephritis and multiple urologic procedures.   ALLERGIES:  1. CODEINE.  2. SULFA.  3. LATEX.   OBSTETRICAL HISTORY:  Remarkable for a low transverse cesarean section  in 2006 for a female infant at 24-5/[redacted] weeks gestation weighing 1 pound 7  ounces. Remarkable for chronic abruption and neonatal death at 6 days of  age and preterm premature rupture of membranes.   PAST MEDICAL HISTORY:  1. History of postpartum depression.  2. History of mastitis.  3. Childhood varicella.  4. Asthma.  5. Migraines.  6. Urolithiasis with pyelonephritis and multiple urologic procedures.   PAST SURGICAL HISTORY:  1. C. section in 2006.  2. Wisdom teeth in 1996.  3. Neck surgery in 2003.  4. Multiple urologic procedures this pregnancy.   FAMILY HISTORY:  Remarkable for grandmother and grandfather with heart  disease.  Mother, grandmother and grandfather with hypertension.  Uncle  with leukemia.  Uncle and grandmother with thyroid problems.  Maternal  grandfather with melanoma.   GENETIC HISTORY:  Remarkable for brother with autism.   SOCIAL HISTORY:  Patient is married to [facility] [employer] [facility], who is involved  and supportive.  She does not report a religious affiliation.  She  denies any alcohol, tobacco or drug use.   PRENATAL LABS:  Hemoglobin 12.8, platelets 224, blood type A+, antibody  screen negative, RPR nonreactive.  Rubella immune.  Hepatitis negative.  Gonorrhea and Chlamydia declined.  Cystic fibrosis negative.   HISTORY OF CURRENT PREGNANCY:  Patient entered care at 9 weeks.  She had  nuchal translucency on ultrasound and had some cramping at 15 weeks.  She declined her quad screen.  She agreed to start Delalutin at that  time.  She had an ultrasound at 18 weeks which showed a small choroid  plexus cyst.  She developed kidney stones at 22 weeks and also had a  work-up for preterm labor at that time.  Fetal fibronectin was negative.  She was started on Procardia for preterm contractions.  She was referred  to urology at 26 weeks and was later treated with stent placement, a  right nephrostomy was placed in December by Dr. Laverle Patter.  She passed  multiple stones.  She developed an allergy to morphine.  Repeat fetal  fibronectin was done.  She continued on Procardia and 17P.  Glucola was  elevated.  Three-hour GTT was done and was normal.  She continued to  have trouble with pain related to her nephrostomy tube and stones.  Developed pyelonephritis, right lower lobe pneumonia and was treated  with Elita Quick for pseudomonas in the urine.  In late January, the  nephrostomy was removed and a stent was placed and she presents today in  labor.   OBJECTIVE:  VITAL SIGNS:  Vital signs stable, afebrile.  HEENT:  Within normal limits.  NECK:  Thyroid normal, not enlarged.  CHEST:  Clear to auscultation.  CARDIOVASCULAR:  Regular rate and rhythm.  ABDOMEN:  Gravid at 34 cm, vertex to Leopold's, EFM shows fetal heart  rate in the 140s with uterine contractions every 2-3 minutes.  CERVIX:  Cervix is anterior lip, completely  effaced, +1 station, vertex  presentation, bulging membranes.  Patient is complaining of urge to  push.  EXTREMITIES:  Within normal limits.   ASSESSMENT:  1. Intrauterine pregnancy at 36-5/7 weeks.  2. Active labor, transition.  3. Group B Strep unknown.   PLAN:  1. Admit to birthing suites.  Dr. Pennie Rushing notified.  2. Routine M.D. orders.  3. Preparations for delivery.      Marie L. Williams, C.N.M.      Hal Morales, M.D.  Electronically Signed    MLW/MEDQ  D:  02/20/2007  T:  02/20/2007  Job:  [id number]

## 2011-05-17 NOTE — Discharge Summary (Signed)
NAMEBRITTNAE, [name]               ACCOUNT NO.:  [id number]   MEDICAL RECORD NO.:  [id number]          PATIENT TYPE:  INP   LOCATION:  9152                          [facility]ILITY[facility]   PHYSICIAN:  Hal Morales, M.D.DATE OF BIRTH:  02/14/1981   DATE OF ADMISSION:  10/02/2005  DATE OF DISCHARGE:  10/05/2005                                 DISCHARGE SUMMARY   ADMISSION DIAGNOSES:  1.  Intrauterine pregnancy at 19 weeks.  2.  Second trimester bleeding.   DISCHARGE DIAGNOSES:  1.  Intrauterine pregnancy at 19 weeks.  2.  Second trimester bleeding.  3.  Presumed placental disruption.   HOSPITAL COURSE:  [name] is a 30 year old gravida 1, para 0, at 46  weeks' gestation.  Her estimated date of delivery is February 26, 2006.  She  presented with vaginal bleeding that began on the morning of October 02, 2005, as heavy with lower abdominal pain and pelvic pressure with cramping.  She was evaluated at [facility] office and had an ultrasound  showing a viable fetus with positive fetal heart tones.  Cervix measuring 3  cm with the blood clot noted.  Her pregnancy has been followed by the  [facility] certified nurse midwife service and has been  remarkable for (1) First trimester bleeding.  (2) Asthma.  (3) Migraines.  (4) Family history of autism.  The patient was admitted to antenatal unit on  continuous toco monitoring, started on Motrin 600 mg p.o. q.6h. also  Terbutaline 2.5 mg p.o. q.4-6h. p.r.n. as well as bed rest with bathroom  privileges.  By hospital day #1, her vaginal bleeding was stable but  continued.  She was having approximately 6-10 uterine contractions per hour  and received Procardia which relieved the contractions.  This was in  addition to her Terbutaline.  By hospital day #2, she was noting bright red  bleeding during wiping.  She was also having tightening and firmness of her  uterus.  Her Terbutaline was changed to every 4 hours around  the clock at  that point.  Fetal heart tones are stable.  Her vital signs are stable, and  she was afebrile.  By hospital day #3, she was having less pain and less  bleeding.  Vital signs are stable.  She is afebrile.  Cervix was long and  closed.  The patient was deemed to have received the full benefit of her  hospital stay and was discharged home.  Her coagulopathy studies are still  pending.   DISCHARGE INSTRUCTIONS:  Bed rest with bathroom and shower privileges only.  She is to refrain from intercourse.   DISCHARGE MEDICATIONS:  1.  Terbutaline 2.5 mg p.o. q.4h.  2.  Ibuprofen 600 mg p.o. q.6h. p.r.n. pain.   LABS PENDING AT DISCHARGE:  1.  ANA  2.  ACLA  3.  LAC  4.  anti- Thrombin III  5.  Protein S  6.  Protein C  7.  Factor V Leiden  8.  anti-beta glycoprotein  9.  homocysteine concentration   Discharge follow up will occur at  [facility] this week.      Cam Hai, C.N.M.      Hal Morales, M.D.  Electronically Signed    KS/MEDQ  D:  10/05/2005  T:  10/05/2005  Job:  [id number]

## 2011-05-17 NOTE — H&P (Signed)
NAMESOLEY, Amanda [name]               ACCOUNT NO.:  [id number]   MEDICAL RECORD NO.:  [id number]          PATIENT TYPE:  INP   LOCATION:  9374                          [facility]ILITY[facility]   PHYSICIAN:  Osborn Coho, M.D.   DATE OF BIRTH:  05-12-1981   DATE OF ADMISSION:  10/02/2005  DATE OF DISCHARGE:                                HISTORY & PHYSICAL   HISTORY OF PRESENT ILLNESS:  Amanda [name] is a 30 year old gravida 1, para 0,  at [redacted] weeks gestation. Estimated date of delivery February 26, 2006, who  presents with vaginal bleeding, which is now light. At 11:45 this a.m., the  patient began bleeding heavily with lower abdominal pain and pelvic pressure  with cramping. She was evaluated at the office of [facility] today and had an  ultrasound, which showed a viable fetus with positive fetal heart tones and  cervix measuring 3 cm with a blood clot noted. The patient is admitted to  [facility] for rest and evaluation of vaginal bleeding.  Her pregnancy has been followed by the Certified Nurse Midwife Services at  [facility] and is remarkable for:  1.  First trimester bleeding.  2.  Asthma.  3.  Migraines.  4.  Family history of autism.   OBSTETRICAL HISTORY:  Is the current pregnancy. She began pre-natal care at  the office of [facility] on July 16, 2005 at approximately [redacted] weeks gestation.  Estimated date of confinement determined by early pregnancy ultrasound and  confirmed with followup. The patient began having early first trimester  bleeding and a large subchorionic hemorrhage was noted on ultrasound at 10  weeks 4 days. This subchorionic hemorrhage continued to bleed and the  patient has had several ultrasounds. The patient states that ultrasound on  yesterday, October 01, 2005 showed resolution of subchorionic hemorrhage,  however, the patient began heavy vaginal bleeding today with a large blood  clot noted on ultrasound.   LABORATORY DATA:  Pre-natal lab work on July 29, 2005  finds hemoglobin and  hematocrit 13.0 and 39.0. Platelets 228,000. Blood type and Rh A positive.  Antibody screen negative. VDRL non-reactive, Rubella immune. Hepatitis B  surface antigen negative. HIV non-reactive. Pap smear within normal limits.  Gonorrhea and Chlamydia negative. CF testing negative.   PAST MEDICAL HISTORY:  Significant for asthma, and a history of migraines.  The patient began pregnancy with a decreased BMI at 18.   ALLERGIES:  CODEINE, SULFA. LATEX (causes a rash).   SOCIAL HISTORY:  She denies the use of tobacco, alcohol, or illicit drugs.  Amanda [name] is a 30 year old married Caucasian female. Her husband, Annie Main is involved and supportive. They do not subscribe to a religious  faith.   FAMILY HISTORY:  Maternal grandmother and maternal grandfather with  myocardial infarction. Patient's mother, maternal grandmother, maternal  grandfather, and paternal grandfather with chronic hypertension. A maternal  uncle died of leukemia. Maternal grandmother and maternal uncle with a  history of thyroid disease. Paternal grandfather stroke. Maternal  grandfather melanoma and prostate cancer. Maternal uncle leukemia.   PAST SURGICAL  HISTORY:  Wisdom teeth 1996. Neck surgery in 2003.   GENETIC HISTORY:  The patient's brother is autistic.   REVIEW OF SYSTEMS:  Is as described above. The patient is [redacted] weeks pregnant  with vaginal bleeding, pelvic pressure, and abdominal cramping.   PHYSICAL EXAMINATION:  VITAL SIGNS:  Stable. The patient is afebrile.  HEENT:  Unremarkable.  HEART:  Regular rate and rhythm.  LUNGS:  Clear.  ABDOMEN:  Soft and non-tender. Positive bowel sounds. Fetal heart tones are  positive. Continuous toco is applied for monitoring of contractions. Ten  minutes of the strip is noted at present with possibly 1 contraction. The  patient is having minimal vaginal bleeding noted on the pad at present.  EXTREMITIES:  No pathologic edema. Deep tendon  reflexes 1+ with no clonus.  No calf tenderness bilaterally.   LABORATORY DATA:  Her CBC showed white blood cell 12.5, hemoglobin and  hematocrit 10.5 and 31.5. Platelets 207,000.   ASSESSMENT:  Intrauterine pregnancy at 19 weeks with vaginal bleeding and  pelvic pressure with uterine contractions.   PLAN:  Admit per Dr. Osborn Coho. Continuous toco monitoring. Fetal heart  tones q. shift. Motrin 600 mg p.o. q. 6 hours x48 hours. Terbutaline 2.5 mg  p.o. q. 4 to 6 hours p.r.n. for greater than 4 contractions per hour.  Bedrest with bathroom privileges. Pad count. Call MD for increased bleeding.  MD orders as written. MD to follow.      Amanda [name], C.N.M.      Osborn Coho, M.D.  Electronically Signed    SDM/MEDQ  D:  10/02/2005  T:  10/02/2005  Job:  [id number]

## 2011-05-17 NOTE — Discharge Summary (Signed)
[name], Amanda [name]               ACCOUNT NO.:  [id number]   MEDICAL RECORD NO.:  [id number]          PATIENT TYPE:  INP   LOCATION:  9317                          [facility]ACILITY[facility]   PHYSICIAN:  Naima A. Dillard, M.D. DATE OF BIRTH:  04-Aug-1981   DATE OF ADMISSION:  11/06/2005  DATE OF DISCHARGE:  11/15/2005                                 DISCHARGE SUMMARY   ADMITTING DIAGNOSES:  1.  Intrauterine pregnancy at 24 and 0/7 weeks.  2.  Chronic abruption.  3.  Vaginal bleeding.   DISCHARGE DIAGNOSES:  1.  Intrauterine pregnancy at 24 and 0/7 weeks.  2.  Chronic abruption.  3.  Vaginal bleeding.  4.  Status post cesarean delivery of a premature infant at 25 and 4/7 weeks.  5.  Breech presentation.  6.  Preterm premature rupture of membranes.  7.  Active labor.   HOSPITAL PROCEDURES:  1.  Electronic fetal monitoring.  2.  Magnesium sulfate tocolysis.  3.  Ultrasound.  4.  Primary low transverse cesarean section.  5.  Spinal anesthesia.   HOSPITAL COURSE:  Patient was admitted with chronic abruption and bleeding  at 24 weeks.  She was given Delalutin, placed on bed rest and magnesium  sulfate for tocolysis.  NICU consult was done.  She continued over the next  week with intermittent episodes of red bleeding.  Fetal heart rate remained  within normal limits with irregular contractions despite the magnesium  sulfate.  Blood sugars were within normal limits throughout her stay.  On  November 11, 2005 she was noted to have increased contractions and had  required terbutaline for extra tocolysis.  On the evening of November 11, 2005 she ruptured her membranes for blood stained fluid and contractions  commenced.  She was found to be breech in her presentation again.  Cervix  became more dilated.  Decision was made to proceed with cesarean section at  that time.  She was delivered of a female infant named Jean Rosenthal with cord pH of  7.4.  Apgars were 1, 0, and 6.  Weight was 1 pound 7  ounces.  Baby was taken  to the NICU and patient was taken to recovery and then to the postpartum  unit where she recovered well.  Over the next three days she increased her  ambulation, increased her diet to regular diet which she tolerated well.  Incision was clean, dry, and intact with Steri-Strips and subcuticular  sutures.  Lochia decreased and she remained afebrile.  On the third day she  was doing well.  Chest was clear.  Heart regular rate and rhythm.  Abdomen  was soft and appropriately tender.  Incision clean, dry, and intact.  Lochia  was small.  Extremities within normal limits and she was deemed to have  received the full benefit of her hospital stay.  Discharged home.   DISCHARGE MEDICATIONS:  1.  Motrin 600 mg p.o. q.6h. p.r.n.  2.  Tylox one to two p.o. q.4h. p.r.n.   DISCHARGE LABORATORIES:  White blood cell count 14.7, hemoglobin 9.4,  platelets 193.   DISCHARGE  INSTRUCTIONS:  Per [facility] handout.   DISCHARGE FOLLOW-UP:  In six weeks or p.r.n.      Marie L. Williams, C.N.M.      [name], M.D.  Electronically Signed    MLW/MEDQ  D:  11/15/2005  T:  11/15/2005  Job:  [id number]

## 2011-05-17 NOTE — Discharge Summary (Signed)
[name], Amanda [name]               ACCOUNT NO.:  [id number]   MEDICAL RECORD NO.:  [id number]          PATIENT TYPE:  INP   LOCATION:  9149                          FACILITY:  [facility]   PHYSICIAN:  Osborn Coho, M.D.   DATE OF BIRTH:  1981-06-26   DATE OF ADMISSION:  10/22/2005  DATE OF DISCHARGE:  10/24/2005                                 DISCHARGE SUMMARY   ADMITTING DIAGNOSES:  1.  Intrauterine pregnancy at 21-6/7 weeks.  2.  Second trimester bleeding.   DISCHARGE DIAGNOSES:  1.  Intrauterine pregnancy at 21-6/7 weeks.  2.  Second trimester bleeding.  3.  Preterm labor.   HOSPITAL PROCEDURES:  1.  Electronic fetal monitoring.  2.  Delalutin injection.  3.  Terbutaline pump.   HOSPITAL COURSE:  Patient was admitted for magnesium sulfate administration  prior to initiation of terbutaline pump.  Consult was made with Dr. Gavin Potters  in light of first and second trimester bleeding with uterine contractions.  Recommendation was made for 24 hours of magnesium followed by transition to  terbutaline pump.  Upon admission patient showed uterine irritability with  contractions every four to six minutes.  No bleeding was noted on admission.  Delalutin was begun on hospital day #2.  Patient reported feeling better  with decreased contractions.  Fetal monitor showed reassuring fetal heart  rate with scattered intermittent contractions.  Transition was made on  hospital day #2 to terbutaline pump and magnesium sulfate was discontinued.  Contractions resolved somewhat with no further bleeding and magnesium level  was 5.6 upon discontinuing it and on October 24, 2005 patient was afebrile.  Vital signs were stable.  Terbutaline pump had been successfully initiated  with 0-1 contractions per hour and decision was made that she had received  full benefit of her hospital stay and was discharged home.   DISCHARGE MEDICATIONS:  1.  Terbutaline pump per protocol.  2.  Prenatal vitamins.   DISCHARGE LABORATORIES:  None.   DISCHARGE INSTRUCTIONS:  Patient will maintain bed rest and observe for  further bleeding at home and terbutaline pump will be monitored through  [facility].   DISCHARGE FOLLOW-UP:  One week at [facility] or p.r.n.      Marie L. Williams, C.N.M.      Osborn Coho, M.D.  Electronically Signed    MLW/MEDQ  D:  01/03/2006  T:  01/03/2006  Job:  [id number]

## 2011-05-17 NOTE — Op Note (Signed)
[name], Amanda [name]               ACCOUNT NO.:  [id number]   MEDICAL RECORD NO.:  [id number]          PATIENT TYPE:  AMB   LOCATION:  SDC                           [facility]  [facility]   PHYSICIAN:  Heloise Purpura, MD      DATE OF BIRTH:  21-May-1981   DATE OF PROCEDURE:  01/27/2007  DATE OF DISCHARGE:                               OPERATIVE REPORT   PREOPERATIVE DIAGNOSES:  1. Right ureteral obstruction.  2. Nephrolithiasis.  3. Pregnancy.   POSTOPERATIVE DIAGNOSES:  1. Right ureteral obstruction.  2. Nephrolithiasis.  3. Pregnancy.   PROCEDURE:  1. Cystoscopy.  2. Right ureteral stent placement.  3. Removal of right nephrostomy tube.   SURGEON:  Heloise Purpura, MD   ANESTHESIA:  Spinal.   COMPLICATIONS:  None.   INDICATIONS:  [name] is a 30 year old female who is currently [redacted] weeks  pregnant.  She was found to have right ureteral obstruction thought to  be secondary to an obstructing calculus.  She underwent a renal  ultrasound which demonstrated hydronephrosis along with areas of  echogenic material concerning for infection.  After discussing options  for renal drainage, it was decided to proceed with a right nephrostomy  tube.  [name] has subsequently developed Pseudomonas pyelonephritis and  has been tolerating her nephrostomy tube poorly with significant pain  and discomfort.  We therefore discussed other options, and she elected  to proceed with a right ureteral stent placement.   Potential risks and benefits were discussed with the patient, and she  consented.  The risks and benefits were also discussed with the  patient's obstetrician, Dr. Dois Davenport Rivard, who felt that the risks would  be acceptable.   DESCRIPTION OF PROCEDURE:  The patient was taken to the operating room,  and a spinal anesthetic was administered.  She was administered broad-  spectrum and culture-specific preoperative antibiotic, placed in the  dorsal lithotomy position, and prepped and draped in  the usual sterile  fashion.  Next, cystourethroscopy was performed.  The bladder was noted  to be of normal appearance except for external compression from the  fetus.  The right ureteral orifice was identified, and a 0.038 guidewire  was inserted up into the right renal pelvis and spot fluoroscopy of the  kidney demonstrated the wire to be in the appropriate position.  A 6 x  24 double-J ureteral stent was then advanced over the wire using a  Seldinger technique and appropriately positioned under cystoscopic  guidance, with again, spot fluoroscopy used up in the kidney to  demonstrate appropriate placement.  The wire was then removed.  The  nephrostomy tube was then removed, and again, spot fluoroscopy was used  to confirm correct placement of the upper curl of the ureteral stent.  The patient's bladder was emptied, and the procedure was ended.  She  appeared to tolerate the procedure well and without problems.  Continuous fetal heart monitoring was performed throughout the  procedure, and there was no evidence for fetal distress throughout the  procedure.           ______________________________  Heloise Purpura, MD  Electronically Signed     LB/MEDQ  D:  01/27/2007  T:  01/27/2007  Job:  [id number]

## 2011-05-17 NOTE — H&P (Signed)
NAMEMAKINLEY, MUSCATO               ACCOUNT NO.:  [id number]   MEDICAL RECORD NO.:  [id number]          PATIENT TYPE:  INP   LOCATION:  9153                          [facility]ILITY:  [facility]   PHYSICIAN:  Crist Fat. Rivard, M.D. DATE OF BIRTH:  [birth year]   DATE OF ADMISSION:  02/06/2007  DATE OF DISCHARGE:                              HISTORY & PHYSICAL   Ms. Krzywicki is a 30 year old gravida 2, para 0-1-0-0 at 34-5/7 weeks,  who presents tonight for admission secondary to urinary tract infection  with pseudomonas.  Her history has been remarkable for a history of  urinary tract infection with pseudomonas for the last several weeks.  She is status post a right ureteral stent on January 27, 2007 for  urolithiasis after failing nephrostomy x3.  She has also been treated  for acute pyelonephritis with pseudomonas as an outpatient and received  1-2 IM injections daily with Elita Quick.  This was noted by her providers to  knowingly be in adequate treatment.  However, the patient refused  inpatient antibiotic treatment.  On 02/02/07 she was seen by Dr. Laverle Patter,  the urologist, and urine culture was positive for pseudomonas and did  have some sensitivities to ceftazidime, Cipro, ticarcillin and  tobramycin.  It was intermediately sensitive to Rocephin and ampicillin.  Dr. Estanislado Pandy spoke with the patient today.  She was continuing to have  pain with this urinary tract infection and she was advised that we  recommended admission for IV antibiotics.  The patient did finally agree  to this.   Her history is also remarkable for:  1. Previous cesarean delivery at [redacted] weeks gestation with neonatal      death at 69 days of age.  2. History of chronic abruption and preterm premature rupture of the      membranes in her prior pregnancy.  3. Asthma.  4. Low prepregnancy weight and body mass index.  5. 17P given weekly.   The patient has been seen several times during this pregnancy.  She was  seen in maternity  admissions unit on January 18, 2007 at 32-1/7 weeks  for a third dose of parenteral antibiotics for outpatient management of  pyelo against medical advise.  She continued to feel the same as she had  been.  She did have upper respiratory issues noted and had some  consolidation in the right lower lobe on chest x-ray which was read by  the radiologist as pneumonia.  At that time, the patient was recommended  for admission.  However, the patient continued to decline.  Parenteral  antibiotics were offered by [facility], however,  these were declined for coverage.  Infectious disease consult was  obtained with Dr. Roxan Hockey and the recommended was given for [facility] to  be given 2 grams IV q. 8 h.  However, the patient then signed by [facility] and  an alternate plan was made to give the [place] twice a day at home.  The  patient received this for full course.  However, she still had issues.  She also has a history of latex sensitivity,  codeine sensitivity, sulfa  sensitivity and itching with Dilaudid and Morphine.   PRENATAL LABS:  Blood type is A+, Rh antibody negative, VDRL  nonreactive, rubella titer is immune.  Hepatitis B surface antigen is  negative.  Cystic fibrosis testing is negative.  GC and Chlamydia  cultures were declined in the first trimester.  Pap was normal in  February 2007.  Hemoglobin upon entering the practice was 12.8 and it  was within normal limits at 28 weeks.  Fetal fibronectin was negative in  December.  She had an elevated one hour Glucola at 155 with a normal 3  hour GTT.   OBSTETRICAL HISTORY:  In 2006, she had a primary low transverse cesarean  section for a female infant with weight 1 pound 7 ounces at 24-5/7 weeks.  She had a chronic abruption that was noted at 17-18 weeks and then  premature rupture of membranes occurred during the patient's  hospitalization and the infant was delivered had to be delivered very  early.  Neonatal death occurred  in 6 days.  She also had bleeding with  that.  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  She also had  no issues with her blood pressure.   HISTORY OF THIS PREGNANCY:  The patient entered care at approximately 9  weeks.  She was having some fatigue.  17P __________  was recommended  and was initiated by 16 weeks.  She had an ultrasound at 18 weeks  showing normal growth.  The cervix was normal.  There was a small  choroid plexus cyst.  Quadruple screen was declined.  She was seen at 22  weeks for bladder infection and kidney stones.  Fetal fibronectin was  done at 22 weeks and was negative.  Choroid plexus cyst resolved by 24  weeks.  Fetal fibronectin was repeated approximately every 2 weeks  throughout the pregnancy.  She was having multiple contractions by 25  weeks.  She was offered Brethine but declined this.  She was offered  Procardia and she did start that at 25 weeks.  Her fetal fibronectin's  remained negative.  At 26 weeks she had more contractions and began to  have more pain with urination.  She had a renal ultrasound showing  multiple stones.  There was discussion then with the nephrologist and  urologist for a planned urostomy for placement of a nephrostomy tube.  Dr. Estanislado Pandy consulted with the nephrologist and urologist.  She had a  right nephrostomy done on December 19, 2006.  She had received some  morphine and did get hives.  Fetal fibronectin again remained negative.  She had another ultrasound at 30 weeks showing normal growth and  development.  She was on Procardia 60 mg XL at that time.  She had an  elevated Glucola and had a normal 3-hour.  She was seen in maternity  admissions unit on January 15, 2007 following stent replacement.  She  had more pain and she began to have more subsequent pain, she also had  chest pain, shortness of breath and tachycardia.  She was seen in  maternity admissions unit and declined inpatient hospitalization.  Dr. Pennie Rushing  reviewed the risks of this with the patient, however, the  patient continued to refuse.  Elita Quick was given 2 grams IM twice a day.  She signed out AMA twice from [facility].  The nephrostomy tube was removed on  January 27, 2007 and a stent was placed.  The stent had to be replaced  subsequent  to this and urine culture remained positive for pseudomonas  with limited sensitivities and that is what lead to the patient's  hospitalization today.   PAST MEDICAL HISTORY:  1. The patient has had a history of asthma.  2. She also has had a history of migraines.   ALLERGIES:  She is allergic to codeine, sulfa, latex, morphine and  Dilaudid.   PAST SURGICAL HISTORY:  1. Wisdom teeth removed in 1996.  2. Neck surgery in 2003.  3. Previously noted primary low transverse cesarean section in 2006.  4. Nephrostomy tube placed and then a stent placed x2.   FAMILY HISTORY:  Maternal grandmother and paternal grandfather with  coronary artery disease.  Mother, maternal grandmother, maternal  grandfather and paternal grandfather with hypertension.  Maternal  grandmother with thyroid disease.  Paternal grandfather had a CVA.  Maternal grandfather had melanoma and prostate cancer.  She has a  brother who is autistic.  The patient is married to the father of the  baby.  He is involved and is supportive.  They do not subscribe to a  religious faith.  She denies alcohol, drug or tobacco use during this  pregnancy.  She is Caucasian and college educated.  She is currently not  employed.  Her partner is also college educated and employed as a  Best boy.   REVIEW OF SYSTEMS:  Previously noted pain on the right side and right  CVA tenderness.   PHYSICAL EXAMINATION:  VITAL SIGNS:  Stable.  The patient is afebrile.  HEENT:  Within normal limits.  LUNGS:  Bilateral breath sounds are clear.  HEART:  Regular rate and rhythm without murmur.  BREASTS:  Soft and nontender.  ABDOMEN:  Fundal height is  approximately 34 cm.  Fetal heart rate is  reassuring.  There are occasional contractions noted.  CBC shows a white  blood cell count of 13.2, hemoglobin of 9.7, neutrophils of 76.  EXTREMITIES:  Deep tendon reflexes are 2+ without clonus.  There is a  trace edema noted.  PELVIC:  Exam is deferred.   IMPRESSION:  1. Intrauterine pregnancy at 34-5/7 weeks.  2. Urinary tract infection with pseudomonas remaining.  3. Status post right ureteral stent.  4. History of previous cesarean section with neonatal loss with plan      for repeat in March at 38 weeks.   PLAN:  1. Admit to the [facility] in [place] for consult with Dr.      Silverio Lay as attending physician on the antenatal unit.  2. Saline lock.  3. Regular diet.  4. Vital signs q. 4 while awake.  5. Continuous electronic fetal monitoring initially.  6. I&O's.  7. Bed rest with bathroom privileges.  8. Fortaz 1 gram IV q. 8 h.  9. Flexeril 10 mg 1 p.o. t.i.d. x3 days.  However, the patient has     declined this.  10.Tylenol 1-2 tablets q. 4-6 h p.r.n. pain.  11.The plan will also to be to evaluate possibility of home health      care after 48 hours.  12.M.D.'s will follow.      Renaldo Reel Emilee Hero, C.N.M.      Crist Fat Rivard, M.D.  Electronically Signed    VLL/MEDQ  D:  02/07/2007  T:  02/07/2007  Job:  [id number]

## 2011-05-17 NOTE — Consult Note (Signed)
Amanda [name], Amanda [name]               ACCOUNT NO.:  [id number]   MEDICAL RECORD NO.:  [id number]          PATIENT TYPE:  MAT   LOCATION:  MATC                          [facility]   PHYSICIAN:  Hal Morales, M.D.DATE OF BIRTH:  [birth year]   DATE OF CONSULTATION:  01/18/2007  DATE OF DISCHARGE:                                 CONSULTATION   CARE MANAGEMENT NOTE:   HISTORY OF PRESENT ILLNESS:  The patient is a 30 year old, gravida 2,  para 0, at 32-1/[redacted] weeks gestation who is being followed for pregnancy  complicated by the following features.  1. History of second trimester loss after chronic abruption and      preterm premature rupture of membranes in a previous pregnancy.  2. Previous cesarean section.  3. Acute pyelonephritis.  4. Recurrent renal stones.  5. Status post third nephrostomy placement.  6. Sympathetic pleural effusion.  7. Remote history of asthma requiring no medications during this      pregnancy.  8. Migraines.   The patient presents today for a third dose of parenteral antibiotics  for outpatient management of pyelonephritis against medical advice.  She  reports that she feels essentially the same with continued pain at the  site of her nephrostomy tube placement as well as is in and in the right  anterior thorax.  She denies coughing up any sputum.  She denies  shortness of breath.   She states that she is drinking large amounts of fluid at home, though  this is not the history that was given to her initial Environmental consultant.  She  does state that her appetite is poor, and she is unable to eat meat, but  she states that she is eating fruit, though in her report to the nurse,  she admitted to having eaten only one waffle today.   On initial evaluation, the temperature was 97.5, pulse 104, respirations  20, blood pressure 125/64.  Her O2 saturation during this visit hovered  in the 95-97 range which is stable from her evaluation yesterday;  however, she did  have O2 saturation as low as 90 on today's evaluation.  This prompted evaluation by chest x-ray, and in followup to the chest x-  ray done on January 16, 2007, the patient continues to have a right  pleural effusion, but now has some consolidation in the right lower lobe  which is read by the radiologist as pneumonia.  White blood cell count  today is 11.7 with 83% neutrophils.  Her hemoglobin remains at 9.8.  Fetal heart rate is reassuring.   IMPRESSION:  Pregnancy complicated by the above-noted features with the  acute features being those of pyelonephritis and now apparent pulmonary  consolidation and possible pneumonia.   DISPOSITION:  A long discussion was again held with the patient and her  mother recommending admission for intensive therapy of her  pyelonephritis as well as pulmonary toilette to minimize the risk of  advancing pulmonary consolidation.  The patient again refuses.  I have  also recommended evaluation for the difficulty that she states is a  barrier to her remaining  in the hospital in that she can't take it  emotionally.  She is resistant to such evaluation and does not accept  that recommendation.  In light of her refusal of the recommended  therapy, I have again consulted with the infectious disease consultant  here at the [facility] system and been advised that, should she  have a pneumonia, Elita Quick should allow coverage of that infection as well  as a urinary tract infection and that, in light of the decreasing white  count, pulmonary toilette may be the only addition that is required at  this time.  I have discussed this with the patient and her mother and  have made arrangements for her to have an incentive spirometer, be  instructed in it use, and take his home for q. 2 h. use.  Given that the  patient is unwilling to stay in the hospital for the recommended  admission, she agrees to return on January 19, 2007, to have [place] 2 g  administered  parenterally either intravenously or intramuscularly, and  she will decide which of those she will accept.  If indeed the patient's  clinical status is stable, I would recommend repeating her white blood  cell count on January 20, 2007, to ensure that, from a laboratory  standpoint, she is improving.      Hal Morales, M.D.  Electronically Signed     VPH/MEDQ  D:  01/18/2007  T:  01/18/2007  Job:  [id number]

## 2011-05-17 NOTE — H&P (Signed)
[name], [name]               ACCOUNT NO.:  [id number]   MEDICAL RECORD NO.:  [id number]          PATIENT TYPE:  INP   LOCATION:  9157                          [facility]ACILITY:  [facility]   PHYSICIAN:  Naima A. Dillard, M.D. DATE OF BIRTH:  [birth year]   DATE OF ADMISSION:  11/06/2005  DATE OF DISCHARGE:                                HISTORY & PHYSICAL   HISTORY OF PRESENT ILLNESS:  [name] is a 30 year old gravida 1, para 0  at 24-0/7 weeks who presents for admission secondary to vaginal bleeding.  The patient's history has been remarkable for first and second trimester  bleeding. The patient had an initial large subchorionic hemorrhage noted in  the first trimester with several bleeding episodes. This was followed by  ultrasound. The subchorionic hemorrhage appeared to resolve by 18 weeks when  it was not noted on ultrasound. However, patient had an episode of heavy  vaginal bleeding and was admitted on October 4 until October 05, 2005. The  patient also was noted to have uterine contractions. The patient was without  bleeding since October 24th. However, patient was seen in the office today  with complaints of increased uterine contraction frequency. The patient has  been monitored at home using home uterine activity monitoring, as well as a  Terbutaline pump due to frequent uterine contractions. The patient reports  increased uterine contraction activity over the past 2 days. The patient was  seen in the office by Dr. Su Hilt earlier on November 8 prior to admission  at which time she was not having vaginal bleeding. A fetal fibronectin was  obtained which was negative. The patient was to continue with Delalutin  injections. Patient also received her first dose of betamethasone on the  date of admission as well, prior to admission. The patient also underwent  ultrasound on the day of admission at [facility] which revealed a breech  presentation, estimated fetal weight 556 kg and gestational  age consistent  with 23 weeks, 1 day. Also of note, amniotic fluid index decreased at 5.7  cm. Fundal placenta was noted to be present and cervical length noted to be  4 cm. After her office visit patient returned home at which time she began  having menstrual-like vaginal bleeding as well as increased uterine  contractions associated with constant low back pain and abdominal pain. The  patient returned to the office for evaluation. The patient is to be admitted  to antepartum for magnesium sulfate and observation.   PREGNANCY IS REMARKABLE FOR:  1.  First and second trimester bleeding with diagnosis of abruption.  2.  History of asthma.  3.  History of migraines.  4.  Family history of autism.  5.  LATEX SENSITIVITY.  6.  History of large first trimester subchorionic hemorrhage resolved on 18      week ultrasound.  7.  Bilateral fetal renal pyelectasis which has resolved.   PRENATAL LABS:  Blood type A positive, antibody screen negative. VDRL  nonreactive. Rubella titer positive. Hepatitis B surface antigen negative.  HIV nonreactive. Pap smear normal. GC and Chlamydia cultures negative.  Cystic  fibrosis testing negative. Hemoglobin 13.9, platelets 228,000. During  her last hospitalization hemoglobin was 10.5, white count 12.5 thousand.  Lupus anticoagulant and coagulopathy panel negative.   OBSTETRICAL HISTORY:  She began prenatal care at [facility] at [redacted] weeks gestation.  She had an early pregnancy ultrasound that revealed the above noted  subchorionic hemorrhage at 10 weeks and 4 days. The patient has had  repetitive ultrasounds since that time due to bleeding. Last ultrasound  again as noted above on day of admission. Her last ultrasound prior to  November 8 was performed on October 22, 2005 which revealed resolution of  pyelectasis. However there was echogenic bowel and evidence of marginal  placental abruptions along anterior and posterior uterine walls but no  retroplacental  abruption identified. Increased echogenicity of the amniotic  fluid was also seen, consistent with hemorrhage within the amniotic fluid.  This is the patient's first pregnancy.   PAST MEDICAL HISTORY:  1.  History of asthma.  2.  History of migraines.   ALLERGIES:  CODEINE, SULFA, LATEX (causes a rash).   PAST SURGICAL HISTORY:  Wisdom teeth, 1996. Neck surgery in 2003.   FAMILY HISTORY:  Maternal grandmother and paternal grandfather with coronary  artery disease. Mother, maternal grandmother, maternal grandfather and  paternal grandfather with hypertension. Maternal grandmother with thyroid  disease. Paternal grandfather CVA. Maternal grandfather melanoma, prostate  cancer. A brother is autistic.   SOCIAL HISTORY:  The patient is married to the father of the baby. He is  involved and supportive. They do not subscribe to a religious faith. She  denies alcohol, tobacco or drug use during this pregnancy. She is Caucasian  and college educated. Her partner is also college educated and is employed  as a [employer] boy.   REVIEW OF SYSTEMS:  As noted above, with uterine contractions approximately  every 5-7 minutes.   PHYSICAL EXAMINATION:  VITAL SIGNS:  Stable, patient is afebrile.  HEENT:  Within normal limits.  LUNGS:  Bilateral breath sounds are clear.  HEART:  Regular rate and rhythm without murmurs, rubs, or gallops.  BREASTS:  Soft and nontender.  ABDOMEN:  Fundal height consistent with 24 weeks at approximately 24 cm.  Abdomen is gravid in contour with uterine contractions palpated, moderate in  intensity.  Incomplete relaxation of the uterine wall is noted in between contractions.  Abdomen is otherwise soft and nontender. Uterus is nontender. Fetal heart  tones 180's.  PELVIC EXAM:  Deferred. However, a small amount of blood is noted on  external genitalia.  EXTREMITIES:  Without edema. Homan sign negative bilaterally.   ASSESSMENT: 1.  Intrauterine pregnancy at 67,  0/7 weeks.  2.  Chronic abruption.  3.  Vaginal bleeding.   PLAN:  1.  Per consult with Dr. Normand Sloop, patient is admitted for magnesium      tocolysis and observation.  2.  Delalutin 250 mg injections weekly.  3.  Completion of betamethasone on November 07, 2005.  4.  PIH labs and other orders per Dr. Normand Sloop.   The patient is to be followed by the M.D. service of [facility].      Rhona Leavens, CNM      [name], M.D.  Electronically Signed    NOS/MEDQ  D:  11/06/2005  T:  11/06/2005  Job:  [id number]

## 2011-05-17 NOTE — Op Note (Signed)
Amanda [name], Amanda [name]               ACCOUNT NO.:  [id number]   MEDICAL RECORD NO.:  [id number]          PATIENT TYPE:  INP   LOCATION:  9317                          FACILITY:  [facility]   PHYSICIAN:  Crist Fat. Rivard, M.D. DATE OF BIRTH:  Aug 22, 1981   DATE OF PROCEDURE:  DATE OF DISCHARGE:                                 OPERATIVE REPORT   PREOPERATIVE DIAGNOSES:  1.  Intrauterine pregnancy at 24 weeks and 5 days.  2.  Chronic placental abruptio.  3.  Active labor despite maximized tocolysis with continuous rupture of      membranes.  4.  Breech presentation.   POSTOPERATIVE DIAGNOSES:  1.  Intrauterine pregnancy at 24 weeks and 5 days.  2.  Chronic placental abruptio.  3.  Active labor despite maximized tocolysis with continuous rupture of      membranes.  4.  Breech presentation.   ANESTHESIA:  Spinal, Dr. Harvest Forest.   OPERATION/PROCEDURE:  Primary low transverse cesarean section.   SURGEON:  Crist Fat. Rivard, M.D.   ASSISTANT:  Cam Hai, C.N.M.   OPERATIVE FINDINGS:  A boy named Jean Rosenthal was born at 48 and taken under  the care of Dr. Francine Graven and the NICU team, was intubated and transferred  to NICU rapidly.   ESTIMATED BLOOD LOSS:  500 mL.   DESCRIPTION OF PROCEDURE:  After being informed of the planned procedure,  with possible complications including bleeding, infection, injury to bowel,  bladder or ureter, need for transfusion, need for classical cesarean  section, informed consent was obtained.  The patient is taken to the OR #1,  given anesthesia without any complications.  The patient is then placed in  the lithotomy position, pelvis tilted to the left.  Fetal heart rate prior  to prepping is in the 150's to 160's with decreased variability.  The  patient is then prepped and draped in the sterile fashion and the previously  inserted Foley catheter is removed for insertion of the latex-free Foley  catheter.  After assessing adequate level of anesthesia, we  proceed with  infiltration of the suprapubic area using 20 mL of Marcaine 0.25 and perform  a Pfannenstiel incision which is brought down to the fascia.  The fascia is  then incised in the lower transverse fashion.  Linea alba is dissected.  Peritoneum is entered in the midline fascia.  We note that this time a very  well developed lower uterine segment.  Visceroperitoneum ws entered in low  transverse fashion which allows this to safely retract bladder by developing  a bladder flap.  Myometrium is then entered in the low transverse fashion  first with a knife, then extended bluntly.  Amniotic fluid is wine-colored.  We assist the birth of a female infant in complete breech presentation and  head is delivered without difficulty.  A nuchal cord is reduced.  Cord is  clamped with two Naomi clamps and the baby is given to the Dr. Francine Graven,  neonatologist present in the room.  Multiple clots are spontaneously  evacuated from the uterus at the time of birth.  Attempt is made to  draw an  arterial cord PH, but due to the small size of the cord, it was impossible  and the venous cord PH was drawn as well as 15 mL from the umbilical vein.  Placenta then manually removed with large clots and fibrin deposit  compatible with chronic and acute abruptio.  Uterine revision is negative.   We then proceeded with closure of the myometrium.  First layers were with a  running locked suture of 0 Vicryl.  Then a second layer with a Lembert  suture imbricating the first one with 0 Vicryl.  Hemostasis is assessed and  adequate.  Both paracolic gutters are cleaned.  Both tubes and ovaries are  assessed and normal, and then the pelvis is profusely irrigated with warm  saline.  Hemostasis is rechecked and completed on the perineal edges with  cautery and then deemed adequate.  The fascia hemostasis was completed with  cautery and the fascia is closed with two running sutures of #1 Vicryl  meeting in the midline.   Incision is irrigated with warm saline and skin is  closed with subcuticular suture of 3-0 Monocryl and Steri-Strips.   Instruments and sponge count is complete x2.  Estimated blood loss is 500  mL.  The procedure was well tolerated by the patient who was taken to the  recovery room in a well and stable condition.   A boy named Jean Rosenthal was born at 12, Apgars still pending.  Cord PH is 7.4.  Baby was taken to the NICU intubated.      Crist Fat Rivard, M.D.  Electronically Signed     SAR/MEDQ  D:  11/12/2005  T:  11/12/2005  Job:  [id number]

## 2011-05-17 NOTE — Discharge Summary (Signed)
NAMEWINNIFRED, DUFFORD               ACCOUNT NO.:  [id number]   MEDICAL RECORD NO.:  [id number]          PATIENT TYPE:  INP   LOCATION:  9153                          [facility]ACILITY:  [facility]   PHYSICIAN:  Janine Limbo, M.D.DATE OF BIRTH:  05/25/1981   DATE OF ADMISSION:  02/06/2007  DATE OF DISCHARGE:  02/09/2007                               DISCHARGE SUMMARY   ADMITTING DIAGNOSES:  1. Intrauterine pregnancy at 34-5/7th's weeks.  2. Urinary tract infection with Pseudomonas, persistently cultured.  3. Status post right ureteral stent.  4. History of previous cesarean section with neonatal loss with plan      for repeat in March at 38 weeks.   DISCHARGE DIAGNOSES:  1. Intrauterine pregnancy at 35 weeks.  2. Resolving urinary tract infection.  3. Right ureteral stent.  4. History of previous cesarean section with plan for repeat on March 07, 2007.   PROCEDURES:  IV antibiotics.   HOSPITAL COURSE:  Ms. Walthall is a 30 year old, gravida 2, para 0-1-0-1,  who was admitted at 34-5/7th's weeks with a history of urinary tract  infection with Pseudomonas that did not respond completely to outpatient  IM parenteral treatment.  Her history was remarkable for a history of  urinary tract infection with Pseudomonas for the last several weeks.  She is status post a right ureteral stent on January 27, 2007, for  urolithiasis.  She had also been treated for acute pyelonephritis with  Pseudomonas as an outpatient and received 1-2 IM injections daily with  Elita Quick.  This was noted by her providers to knowingly be inadequate  treatment, however, the patient refused inpatient antibiotic treatment.  She was re-cultured after completing that and it continued to show  Pseudomonas.  The patient continued to have pain and therefore, she was  advised with the recommended plan was admission for IV antibiotics.  She  was admitted on February 06, 2007, for this course.  Plan was made for  Fortaz 1 gram IV  q.8 h.  Flexeril was offered for back pain, however,  the patient declined this.  Tylenol was taken.  She did well during this  hospitalization.  She had less pain with urination.  She was cared for  by Dr. Estanislado Pandy over the weekend and then on February 09, 2007, the  patient was seen by Dr. Stefano Gaul.  Dr. Estanislado Pandy had talked to the patient  about making a plan for discharge with home health nurses giving Elita Quick  IV 1 gram every 8 hours and then continuing through Friday, February 13, 2007 at 2 p.m.  The plan was also made to discontinue the Procardia that  she had been on for preterm labor contraction management and to  discontinue the Delalutin or 17-P injections weekly.  The patient was in  agreement with this.  Her vital signs were stable.  Her fetal heart rate  was reactive.  There were occasional irregular contractions.  The right  ureteral stent was still in place.  The patient was deemed to have  received the full benefit of her hospital stay and was  discharged home.   DISCHARGE INSTRUCTIONS:  The patient is to continue to monitor for  worsening of urinary tract infection, kidney stone, pain or preterm  labor signs and symptoms.   DISCHARGE MEDICATIONS:  1. Fortaz 1 gram IV q.8 h. to be administered by [facility]      beginning at 10 p.m. tonight and continuing through Friday,      February 13, 2007 at 2 p.m. dose.  2. The patient is to discontinue the Procardia.  3. She is also to discontinue the 17-P or Delalutin.  4. The patient will continue prenatal vitamins.  5. Ambien 10 mg one p.o. q.h.s. p.r.n. was called to [facility]      in [place].   The patient will return to the office in one week or p.r.n.      Renaldo Reel Emilee Hero, C.N.M.      Janine Limbo, M.D.  Electronically Signed    VLL/MEDQ  D:  02/09/2007  T:  02/09/2007  Job:  [id number]

## 2011-05-17 NOTE — H&P (Signed)
Amanda [name], Amanda [name]               ACCOUNT NO.:  [id number]   MEDICAL RECORD NO.:  [id number]          PATIENT TYPE:  INP   LOCATION:  9149                          [facility]ILITY[facility]   PHYSICIAN:  Osborn Coho, M.D.   DATE OF BIRTH:  1981/03/06   DATE OF ADMISSION:  10/22/2005  DATE OF DISCHARGE:                                HISTORY & PHYSICAL   Amanda [name] is a 30 year old, gravida 1, para 0, at 21-6/7 weeks, who  presents for admission for 48 hours of magnesium sulfate prior to initiation  of Terbutaline pump on October 24, 2005.  The patient's history has been  remarkable for first and second trimester bleeding.  She had an initial  large subchorionic hemorrhage noted in the first trimester with several  bleeding episodes.  This was followed by ultrasound.  It seemed to resolve  by 18 weeks where it was not noted on ultrasound; however, 2-3 days later  after her ultrasound, she had a heavy episode of bleeding.  She was seen at  the hospital on October 02, 2005 and was admitted until October 05, 2005 for  bleeding and also was noted to have uterine contractions.  She has been on  Terbutaline at home.  She was seen in maternity admissions this past Friday  for a small amount of bleeding.  She was having some contractions.  Her  Terbutaline was diminished to every 4 hours at home with better control.  She then had a consult with Dr. Gavin Potters, as perinatologist here at [facility] today.  After discussion between Dr. Gavin Potters and Dr. Osborn Coho  from [facility], the decision was to offer the patient admission  for 48 hours with magnesium sulfate and then conversion to Terbutaline pump.  She also was offered Delalutin in the hopes of quietening her uterus.  The  patient did accept this plan of care.  Therefore, she is to be admitted for  this plan.  The patient is not having any bleeding today.  Pregnancy is  remarkable for (1) First and second trimester bleeding with  presumptive  diagnosis of abruption.  (2)  History of asthma.  (3) History of migraines.  (4)  Family history of autism.  (5) LATEX sensitivity.  (6) History of large  first trimester subchorionic hemorrhage, resolved on 18th ultrasound, but  second trimester bleeding began 2 days after that.  (7) Bilateral fetal  renal pyelectasis.   PRENATAL LABORATORY DATA:  Blood type is A positive, Rh antibody negative,  VDRL nonreactive, rubella titer positive, hepatitis B surface antigen  negative, HIV was nonreactive, Pap smear was normal.  GC and Chlamydia  cultures were negative.  Cystic fibrosis testing was negative.  Hemoglobin  was 13 and 13.9.  Platelets were 228.  During her last hospitalization,  hemoglobin was 10.5, white blood cell count was 12.5.  She had a lupus  anticoagulant and coagulopathy panel noted that was completely negative.   OBSTETRICAL HISTORY:  She began prenatal care at the office of [facility] at 10 weeks' gestation.  She had an early pregnancy  ultrasound  that had a large subchorionic hemorrhage noted at 10 weeks and 4 days.  She  has had several ultrasounds since that time.  Her last prenatal ultrasound  prior to this admission was done on October 3 and showed resolution of the  subchorionic hemorrhage.  When she was in the hospital from October 4 to  October 7, she had an ultrasound that showed a large clot.  This was not  repeated prior to her going home.   MEDICAL HISTORY:  She has a history of asthma and a history of migraines.   ALLERGIES:  1.  CODEINE.  2.  SULFA.  3.  LATEX which causes a rash.   SOCIAL HISTORY:  She is married to the father of the baby.  He is involved  and supportive.  His name is Annie Main.  They do not subscribe to a  religious faith.  She denies alcohol, tobacco, or drug use during this  pregnancy.  She is Caucasian and is college educated.  Her partner is also  college educated and is employed as a Health and safety inspector.   FAMILY HISTORY:  Maternal grandmother and maternal grandfather had an MI.  Her mother, maternal grandmother, maternal grandfather, and paternal  grandfather have hypertension.  A maternal uncle died of leukemia.  Maternal  grandmother and maternal uncle with a history of thyroid disease.  Paternal  grandfather had a stroke.  Maternal grandfather had melanoma and prostate  cancer, maternal uncle had leukemia.  Her brother is autistic.   SURGICAL HISTORY:  1.  Wisdom teeth removed in 1996.  2.  Neck surgery in 2003.   REVIEW OF SYSTEMS:  As described above.  The patient is noted to be having  some cramping, but she is having no bleeding at this time.   PHYSICAL EXAMINATION:  VITAL SIGNS:  Stable.  The patient is afebrile.  HEENT:  Within normal limits.  LUNGS:  Bilateral breath sounds are clear.  HEART:  Regular rate and rhythm without murmur.  BREASTS:  Soft and nontender.  ABDOMEN:  Fundal height is approximately 22 cm.  Abdomen is soft and  nontender with positive bowel sounds noted.  Fetal heart tones are in the  140s-150s.  Continuous toco is applied showing uterine irritability and  sporadic uterine contractions.  There is no vaginal bleeding noted.   Ultrasound today was done.  Written results are not available at this time;  however, the patient reports that blood was seen all around the boundaries  of the amniotic sac as well as blood within the amniotic sac.   ASSESSMENT:  1.  Intrauterine pregnancy at 21-6/7 weeks.  2.  Second trimester bleeding with presumptive abruption.   PLAN:  1.  Per consult between Dr. Su Hilt and Dr. Gavin Potters, the patient is admitted      for 48 hours of magnesium sulfate then conversion to the Terbutaline      pump, anticipated on October 24, 2005.  2.  Delalutin 250 mg intramuscular weekly.  This will be anticipated to be      begun tomorrow.  Insurance authorization will be investigated per the      patient's request. 3.  The  patient and husband discussed with Dr. Su Hilt and Dr. Gavin Potters the      plan of care.  They seem to understand that these measures that are      anticipated have no guarantee of effectiveness but are hoped to quieten      the  uterus.  In light of when the patient has less uterine activity, she      tends to have less bleeding.  The patient and husband wish to proceed      with the plan of care.  4.  Medical doctors will follow.      Renaldo Reel Emilee Hero, C.N.M.      Osborn Coho, M.D.  Electronically Signed    VLL/MEDQ  D:  10/22/2005  T:  10/22/2005  Job:  [id number]

## 2011-05-17 NOTE — Consult Note (Signed)
[name], [name] NO.:  [id number]   MEDICAL RECORD NO.:  [id number]          PATIENT TYPE:  MAT   LOCATION:  [facility]                          FACILITY:  [facility]   PHYSICIAN:  Heloise Purpura, MD      DATE OF BIRTH:  April 11, 1981   DATE OF CONSULTATION:  12/11/2006  DATE OF DISCHARGE:                                 CONSULTATION   REQUESTING PHYSICIAN:  Dr. Dierdre Forth.   REASON FOR CONSULTATION:  1. Hydronephrosis.  2. Possible kidney stone.   HISTORY OF PRESENT ILLNESS:  [name] is a 30 year old female who is  26-4/[redacted] weeks pregnant who approximately 5 weeks ago began having gross  hematuria associated with right flank pain with radiation to her right  lower quadrant.  She subsequently passed a few kidney stones and felt  better.  Approximately 1 week ago, she again began having flank pain  this time bilaterally.  This was worse on the right side with again  radiation to her right lower quadrant.  She denies any fever or  vomiting.  She has had some nausea and again has had gross hematuria.  She presented for further evaluation today.  She underwent an ultrasound  yesterday which demonstrated moderate to severe right-sided  hydronephrosis along with ureteral dilation down to the distal ureter.  She did not appear to have hydronephrosis on the left side.  She did  have some small echogenic foci with acoustic shadowing consistent with  possible small renal calculi in the left side.  She also had some  echogenic debris in the renal pelvis on the right side possibly  suggestive of blood and/or infection.  She denies any dysuria.  She has  had some urinary frequency.  She denies a history of kidney stones prior  to this pregnancy.  She denies prior hematuria, urinary tract  infections, STDs, GU malignancy, trauma or surgery.   PAST MEDICAL HISTORY:  History of migraine headaches   PAST GYNECOLOGICAL HISTORY:  She was pregnant and delivered a premature  baby  at [redacted] weeks gestation approximately 1 year ago by C-section.  This  infant subsequently died 6 days later.  She does not have any other  children and has not had other pregnancies.  Thus far, her current  pregnancy has been healthy without problems aside from the history of  present illness.   PAST SURGICAL HISTORY:  1. Removal of wisdom teeth.  2. C-section.   MEDICATIONS:  1. Procardia  2. Prenatal vitamin.  3. Percocet p.r.n.  4. Tylenol p.r.n.   ALLERGIES:  CODEINE causes nausea and vomiting.  SULFA causes a rash.   FAMILY HISTORY:  No history urolithiasis.   SOCIAL HISTORY:  The patient denies tobacco use.  She occasionally  drinks alcohol, but not during her pregnancy.   REVIEW OF SYSTEMS:  A complete review of systems was performed.  All  systems are reviewed and are negative except as in the history of  present illness.   PHYSICAL EXAMINATION:  VITALS:  Temperature 98.3, blood pressure 101/53,  heart rate 68, respirations 18.  CONSTITUTIONAL:  Alert and oriented in no acute distress.  HEENT:  Normocephalic, atraumatic.  NECK:  Supple without lymphadenopathy.  CARDIOVASCULAR:  Regular rate and rhythm without obvious murmurs.  LUNGS:  Clear bilaterally.  ABDOMEN:  Gravid with some right upper quadrant tenderness on palpation.  No rebound tenderness or guarding.  No bruits.  BACK:  Moderate right-sided CVA tenderness.  No left CVA tenderness.  EXTREMITIES:  No edema.  NEUROLOGIC:  Grossly intact.   LABORATORY DATA:  Serum creatinine 0.4.  Fetal fibronectin negative.  Urinalysis demonstrates 11-20 red blood cells and few bacteria with  calcium oxalate crystals and a few squamous epithelial cells.  Dipstick  urinalysis demonstrates urine pH of 6.5 and is negative for nitrates and  leukocyte esterase.  There is dipstick positive protein and blood and  ketones.  Urine culture from November 08, 2006, demonstrated no growth.  Urinalysis from December 03, 2006, demonstrated  0-2 red blood cells  without bacteria.   The patient's renal ultrasound was independently reviewed.  This does  demonstrate moderate to severe right-sided hydronephrosis with  ureterectasis down to the distal ureter.  There is an echogenic focus in  the renal pelvis consistent with blood versus debris.  There are some  small echogenic foci in the left kidney with acoustic shadowing  suggestive of renal calculi.  There is no hydronephrosis on the left  side.   IMPRESSION:  Right-sided hydronephrosis with possible ureteral calculus  and echogenic debris in the right renal pelvis.   RECOMMENDATIONS:  At this time, Delberta's pain is controlled with  medication.  I have low suspicion that she does have infection based on  her dipstick urinalysis and the fact that she has been relatively  asymptomatic from an infectious standpoint.  However, I will plan to  start her on amoxicillin based on the bacteriuria which is likely  related to a contaminated specimen.  I will then plan to repeat a  urinalysis in 1 week.  In addition, I have told Tondra that should she  develop any fever, pain that is uncontrolled or worsening nausea or  vomiting that she should notify me or come to the hospital on an urgent  or emergent basis.   In the meantime, Fraya will plan to strain her urine and has been  provide a prescription for Vicodin to take as needed for pain.  She will  plan to follow up again in 1 week to reassess her symptoms.  She  understands that she potentially may require drainage of her urinary  tract with either a nephrostomy tube or ureteral stent.  However, I  think it is appropriate to manage things conservatively at this time.           ______________________________  Heloise Purpura, MD  Electronically Signed     LB/MEDQ  D:  12/11/2006  T:  12/12/2006  Job:  [id number]

## 2011-05-17 NOTE — Consult Note (Signed)
[name], [name]               ACCOUNT NO.:  [id number]   MEDICAL RECORD NO.:  [id number]          PATIENT TYPE:  MAT   LOCATION:  [facility]                          [facility]ILITY:  [facility]   PHYSICIAN:  Amanda [name], M.D.DATE OF BIRTH:  [birth year]   DATE OF CONSULTATION:  01/17/2007  DATE OF DISCHARGE:                                 CONSULTATION   CARE MANAGEMENT NOTE   HISTORY OF PRESENT ILLNESS:  The patient is a 30 year old white married  female, gravida 2, para 0-1-0-0, who presented to the [facility] on January 16, 2007 having been  referred by her [place] Amanda [name] through the office at [facility] of [facility].  The patient's  current pregnancy is complicated by the following features:  1. Prior cesarean section.  2. Prior preterm delivery at 71 weeks' gestation with neonatal death      at 30 days of age.  3. History of chronic abruption and preterm premature rupture of      membranes in prior pregnancy.  4. Asthma.  5. Renal calculi.  6. Requirement for nephrostomy tube with 2 subsequent replacements of      nephrostomy tubes.  7. Low prepregnancy weight and body mass index.   At the time of presentation, the patient was 31 weeks' and 3 days'  gestation.  She was complaining of right anterior lower chest pain as  well as dyspnea.  She stated that the pain had started during the  placement of her right nephrostomy tube and ureteral dilatation which  had been performed on January 15, 2007.  The patient gave a history of  fever, though had no documentation of the level of her temperature.  She  had had significant pain issues associated with renal calculi and was  currently using Dilaudid for pain management.   During her workup on January 16, 2007, she underwent a chest x-ray to  rule out pneumothorax given her recent history of right nephrostomy tube  placement.  The chest x-ray showed  no evidence of pneumothorax but a  small right pleural effusion.  This was thought to be postinflammatory  from the patient's recent nephrostomy tube placement.  She also  underwent a right renal ultrasound with no evidence of fluid collection  or hematoma.   During the patient's stay, it was noted that though her initial  temperature had been within normal limits a subsequent temperature after  her studies was as high as 101 degrees.  Further evaluation of previous  laboratory studies revealed that a urine culture which had been sent at  the time of her nephrostomy tube placement on January 15, 2007 was  growing greater than 100,000 colonies of Pseudomonas aeruginosa.  At  that time, a complete blood count was ordered and discussions begun  concerning admission for the treatment of presumed pyelonephritis.  The  patient adamantly refused admission to the hospital in spite of long  discussions concerning the indication for that recommendations as well  as the risks of not following that recommendation.  The patient  did  consent to having a complete blood count with differential drawn.  The  white blood cell count was 18.3, hemoglobin was 9.8 and hematocrit 28.7.  Differential showed 89% neutrophils.  This result added to the concern  about pyelonephritis and the resultant complications that could be part  of that diagnosis.  Several discussions were held between me, the  patient and her mother who was accompanying her, with my explaining the  risks of septicemia, possible preterm labor and delivery as well as  sepsis and subsequent health deterioration which could potentially lead  to maternal and/or fetal demise.  In spite of these explanations, the  patient refused admission.   The patient stated that she would consider having parenteral antibiotics  given by Amanda [name], her home health care agency.  A call was made to the  [facility] 24-hour answer line and discussions held with Amanda [name], a  Landscape architect, answering for Amanda [name].  Amanda [name] indicated that Amanda [name] would be able  to give the patient intravenous antibiotics and instructed me to fax an  order that would allow those antibiotics to be administered.  She  explained that the [facility] pharmacist the following day would have those  services authorized by the patient's insurance company and if there were  any concerns that I would be called.   A telephone consultation with Dr. Burnice [name] reviewing the patient's  presentation and laboratory data revealed a recommendation for Fortaz to  be given 2 g IV q.8 h.  This is the order that was written for [facility]  The patient then signed out AMA after having been seen by Dr. Malen [name] of  anesthesia for an anesthesia consult that had been previously ordered  and his recommendation for analgesia and the use of Flexeril for pain  relief.   Other features of the patient's January 16, 2007 evaluation included  tachycardia in the range of 92 as high as 120 but O2 saturation of 100%  on room air.   In spite of further recommendations for admission, the patient decided  to leave the hospital against medical advice and await institution of IV  antibiotic therapy through [facility].   On January 17, 2007, I received a phone call from the [name]  stating that Amanda [name] would be unable to provide any intravenous  antibiotic therapy for the patient.  The patient was notified of this  and a request made to come into the hospital to have intramuscular  antibiotics.  She had received a single dose of Fortaz 2 g IV on the  evening of January 16, 2007 and wanted to come for another dose.  I  again called Dr. Roxan [name] to discuss whether there was any regimen that  might be reasonable for the patient who was traveling from [facility]  for parenteral antibiotics.  He stated that it might be reasonable to  try a single dose of parenteral antibiotics, though clearly this is not the recommended  regimen.  If, however, the patient was symptomatically  improved and laboratory studies were improved, then it would indeed be  reasonable to continue with a once a day dosing schedule.  When the  patient arrived in maternity admissions on January 17, 2007, she was  noted to be afebrile but with continued tachycardia in the range of 110  to as much as 140 beats per minute.  The O2 saturation on January 17, 2007 was closer to 96% on room air.  In light of this change and  presentation,  a recommendation was made for a spiral CT scan.  The  patient, however, was unable to cooperate with the spiral CT because of  her inability to lie flat thought to be secondary to the pain from  insertion of her nephrostomy tube.  In spite of oral analgesia, she was  unable to lie down.  The spiral CT, thus, could not be completed.  The  patient had an intravenous line inserted to give contrast for the CT  scan, and even though she was unable to have the CT scan done, we have  requested that she allow [place] to give her intravenous fluids given that  her urine specific gravity was greater than 1.030 and she gave the  history of poor p.o. intake.  She had previously been unwilling to have  an intravenous line started and any IV fluids given; however, since she  already had the IV in, she did allow [place] to give her a liter of lactated  Ringer's.  She also allowed [place] to give her Elita Quick 2 g intravenously  rather than as an IM injection.  She had been given the choice of an  intravenous line the night before but had chosen to have an IM injection  instead.   Once again, I recommended that the patient stay for in-hospital  admission and reiterated all of the benefits of admission which included  the ability to give the usual prescribed doses of Fortaz on an every 8-  hour basis, the ability to monitor her baby continuously, the ability to  observe her temperature curve and draw blood cultures when appropriate,  and  the ability to support her should tachycardia lead to some degree of  cardiac decompensation.  She had acknowledged understanding of all of  these concerns but again refused admission.   A fetal maternal medicine consultation was then obtained to specifically  look at the issues of fetal well being and whether there were any  indications for considering delivery of the fetus at this time.  Dr.  Toma Copier saw the patient in a 45-minute consultation and outlined his  concerns and recommendations in a written note.  He reiterated the  recommendation for admission for treatment of a presumed pyelonephritis  and reiterated the aforementioned risks of declining to have this  admission.  The patient continued to refuse admission.  He did feel that  a psychological evaluation would be appropriate at some time because of  the patient's unwillingness to reveal to him her reason for not wanting  admission and not complying with the recommended medical therapy.  The patient did consent to the biophysical profile that had been recommended  by Dr. Ander Slade which returned at 6/8.  Once the patient had completed her  liter of IV fluids and her intravenous antibiotics, she again signed out  against medical advice with the promise to return on January 18, 2007  for administration of parenteral Fortaz and evaluation of complete blood  count as well as vital signs.  The patient was instructed because of her  residing in [place] that if she became acutely ill that she should  present to [facility] and allow EMS transport to [facility] rather than trying to drive the distance herself.  We will plan  to reevaluate her on January 26, 2007.   Total time involved in patient care and care coordination on January 16, 2007 is 3 hours.  Total time involved in patient care and care  coordination  by this [place] Scibilia on January 17, 2007 is 2 hours.      Amanda [name], M.D.  Electronically  Signed     VPH/MEDQ  D:  01/17/2007  T:  01/18/2007  Job:  [id number]   cc:   Amanda [name], M.D.  Fax: [phone]   Crist Fat. Rivard, M.D.  Fax: [phone]

## 2011-09-27 LAB — POCT HEMOGLOBIN-HEMACUE: Hemoglobin: 13.6

## 2012-04-29 ENCOUNTER — Other Ambulatory Visit: Payer: Self-pay | Admitting: Family Medicine

## 2012-04-29 DIAGNOSIS — M25511 Pain in right shoulder: Secondary | ICD-10-CM

## 2012-05-07 ENCOUNTER — Ambulatory Visit
Admission: RE | Admit: 2012-05-07 | Discharge: 2012-05-07 | Disposition: A | Discharge status: Home / Self Care | Payer: 59 | Source: Ambulatory Visit | Attending: Family Medicine | Admitting: Family Medicine

## 2012-05-07 DIAGNOSIS — M25511 Pain in right shoulder: Secondary | ICD-10-CM

## 2012-05-07 MED ORDER — IOHEXOL 180 MG/ML  SOLN
7.0000 mL | Freq: Once | INTRAMUSCULAR | Status: AC | PRN
  Administered 2012-05-07: 7 mL via INTRA_ARTICULAR

## 2013-10-22 ENCOUNTER — Other Ambulatory Visit: Payer: Self-pay | Admitting: *Deleted

## 2013-10-22 DIAGNOSIS — R209 Unspecified disturbances of skin sensation: Secondary | ICD-10-CM

## 2013-10-25 ENCOUNTER — Encounter: Payer: Self-pay | Admitting: Surgery

## 2013-11-19 ENCOUNTER — Encounter: Payer: Self-pay | Admitting: Surgery

## 2013-11-22 ENCOUNTER — Encounter: Payer: Self-pay | Admitting: Surgery

## 2013-11-22 ENCOUNTER — Ambulatory Visit (HOSPITAL_COMMUNITY)
Admission: RE | Admit: 2013-11-22 | Discharge: 2013-11-22 | Disposition: A | Discharge status: Home / Self Care | Payer: 59 | Source: Ambulatory Visit | Attending: Surgery | Admitting: Surgery

## 2013-11-22 ENCOUNTER — Ambulatory Visit (INDEPENDENT_AMBULATORY_CARE_PROVIDER_SITE_OTHER): Payer: 59 | Admitting: Surgery

## 2013-11-22 VITALS — BP 108/70 | HR 84 | Ht 65.0 in | Wt 107.7 lb

## 2013-11-22 DIAGNOSIS — R209 Unspecified disturbances of skin sensation: Secondary | ICD-10-CM | POA: Insufficient documentation

## 2013-11-22 NOTE — Progress Notes (Signed)
Patient name: Amanda [name] MRN: [id number] DOB: 1981/08/30 Sex: female   Referred by: Dr. Thomasena Edis  Reason for referral:  Chief Complaint  Patient presents with  . New Evaluation    temperature changes, coldness and numbness in R arm after shoulder surgery X 3 years    HISTORY OF PRESENT ILLNESS: This is a 32 year old female who was referred for pain numbness and coldness of the right arm.  The patient has undergone shoulder surgery x2, the most recent was in July of 2014.  She has undergone nerve conduction studies.  She is also undergone x-rays to rule out cervical rib.  I do not have these results.  Per the patient the right ulnar nerve showed a temperature discrepancy.  The patient is an avid Armed forces operational officer and because of the pain and weakness in the right arm, she has now started plan left handed.  Her symptoms are very disabling.  There is concern for thoracic outlet syndrome and that is what she is referred.  She has no other major medical problems.  She has had a nephrostomy and ureteral stent in the past.  She is a former smoker  Past Medical History  Diagnosis Date  . Pain in right shoulder   . Thoracic outlet syndrome   . Shoulder impingement syndrome   . Adhesive capsulitis of shoulder   . History of migraine headaches   . Kidney stones     Past Surgical History  Procedure Laterality Date  . Shoulder arthroscopy Right 12/2012, 07-13-2013  . Cesarean section    . Nephrostomy    . Ureteral stent placement      History   Social History  . Marital Status: Married    Spouse Name: N/A    Number of Children: N/A  . Years of Education: N/A   Occupational History  . Not on file.   Social History Main Topics  . Smoking status: Former Smoker    Types: Cigarettes    Quit date: 12/30/2005  . Smokeless tobacco: Not on file  . Alcohol Use: Yes     Comment: occassional glass of wine   . Drug Use: No  . Sexual Activity: Not on file   Other Topics Concern  .  Not on file   Social History Narrative  . No narrative on file    Family History  Problem Relation Age of Onset  . Heart disease Mother   . Hyperlipidemia Mother   . Varicose Veins Mother     Allergies as of 11/22/2013 - Review Complete 11/22/2013  Allergen Reaction Noted  . Codeine  10/25/2013  . Darvocet a500 [propoxyphene n-acetaminophen]  10/25/2013  . Dilaudid [hydromorphone hcl]  10/25/2013  . Latex  12/04/2006  . Macrobid [nitrofurantoin macrocrystal]  10/25/2013  . Morphine sulfate  10/25/2013  . Penicillins  12/04/2006  . Sulfa antibiotics  10/25/2013    No current outpatient prescriptions on file prior to visit.   No current facility-administered medications on file prior to visit.     REVIEW OF SYSTEMS: Please see history of present illness, otherwise negative  PHYSICAL EXAMINATION: General: The patient appears their stated age.  Vital signs are BP 108/70  Pulse 84  Ht 5\' 5"  (1.651 m)  Wt 107 lb 11.2 oz (48.852 kg)  BMI 17.92 kg/m2  SpO2 100% HEENT:  No gross abnormalities Pulmonary: Respirations are non-labored Musculoskeletal: There are no major deformities.   Neurologic: No focal weakness or paresthesias are detected, Skin: There are  no ulcer or rashes noted. Psychiatric: The patient has normal affect. Cardiovascular: There is a regular rate and rhythm without significant murmur appreciated.  No supraclavicular mass.  The patient loses her right radial pulse with right arm abduction  Diagnostic Studies: Arterial upper extremity imaging was performed.  This shows triphasic radial ulnar brachial axillary and subclavian artery waveforms.    Assessment:  Right arm pain numbness and temperature changes Plan: At rest, the patient has normal vascular bloodflow.  She does have a positive Allen's test which could be seen in thoracic outlet syndrome.  Her nerve conduction study was essentially normal with the exception of the ulnar nerve.  I discussed with  her that I do not perform the thoracic outlet decompression, nor did any of my partners.  I will make the referral to [facility], Dr. Sondra Come for further evaluation and treatment.  We will try to expedite this given the patient's current insurance issues     V. Charlena Cross, M.D. [facility] Office: [phone] Pager:  [phone]

## 2013-11-23 DIAGNOSIS — G540 Brachial plexus disorders: Secondary | ICD-10-CM | POA: Insufficient documentation

## 2013-11-24 ENCOUNTER — Encounter: Payer: Self-pay | Admitting: Specialist

## 2016-04-21 ENCOUNTER — Emergency Department (HOSPITAL_COMMUNITY)
Admission: EM | Admit: 2016-04-21 | Discharge: 2016-04-21 | Disposition: A | Discharge status: Home / Self Care | Payer: 59 | Attending: Emergency Medicine | Admitting: Emergency Medicine

## 2016-04-21 ENCOUNTER — Emergency Department (HOSPITAL_COMMUNITY): Payer: 59

## 2016-04-21 ENCOUNTER — Encounter (HOSPITAL_COMMUNITY): Payer: Self-pay | Admitting: *Deleted

## 2016-04-21 DIAGNOSIS — Z8669 Personal history of other diseases of the nervous system and sense organs: Secondary | ICD-10-CM | POA: Insufficient documentation

## 2016-04-21 DIAGNOSIS — M79652 Pain in left thigh: Secondary | ICD-10-CM

## 2016-04-21 DIAGNOSIS — Z91040 Latex allergy status: Secondary | ICD-10-CM | POA: Diagnosis not present

## 2016-04-21 DIAGNOSIS — M25562 Pain in left knee: Secondary | ICD-10-CM | POA: Insufficient documentation

## 2016-04-21 DIAGNOSIS — Z87442 Personal history of urinary calculi: Secondary | ICD-10-CM | POA: Insufficient documentation

## 2016-04-21 DIAGNOSIS — Z880 Allergy status to penicillin: Secondary | ICD-10-CM | POA: Diagnosis not present

## 2016-04-21 DIAGNOSIS — Z87891 Personal history of nicotine dependence: Secondary | ICD-10-CM | POA: Insufficient documentation

## 2016-04-21 DIAGNOSIS — Z8679 Personal history of other diseases of the circulatory system: Secondary | ICD-10-CM | POA: Diagnosis not present

## 2016-04-21 NOTE — ED Notes (Signed)
Patient transported to X-ray 

## 2016-04-21 NOTE — ED Provider Notes (Signed)
CSN: [id number]     Arrival date & time 04/21/16  1345 History  By signing my name below, I, Linna DarnerRussell Turner, attest that this documentation has been prepared under the direction and in the presence of non-physician practitioner, Fayrene HelperBowie Jaquari Reckner, PA-C. Electronically Signed: Linna Darnerussell Turner, Scribe. 04/21/2016. 2:07 PM.   Chief Complaint  Patient presents with  . Leg Pain    The history is provided by the patient. No language interpreter was used.     HPI Comments: Amanda [name] is a 35 y.o. female who presents to the Emergency Department complaining of intermittent, moderate left thigh pain for the past several days.  She initially developed L knee pain several weeks ago below her left knee cap. Pt saw her orthopedist, Dr. Rennis ChrisSupple, 3 weeks ago and was told to stay off of her left leg for two weeks; she was diagnosed with patellar tendonitis and was given a brace for her left knee. Pt resumed running 6 days ago and has experienced severe shooting pain from her left knee to her left thigh while running; she notes that her pain only presents with movement and she has minimal pain at rest.  Pt runs long distances frequently (3 to 7 miles), with last run yesterday and today.  She has tried ibuprofen, ice, and heat with no relief. She reports a superficial blood clot in her left leg two years ago that was resolved with Augmentin; she has no h/o other blood clots. Pt denies pain exacerbation with palpation to her left knee and left thigh. She denies recent birth control use, surgery, or long travel. She further denies numbness, back pain, fever, chills, hemoptysis, SOB, chest pain, left hip pain, or any other associated symptoms. She has an appointment scheduled with Dr. Rennis ChrisSupple for tomorrow. She is ambulatory without difficulty.  Past Medical History  Diagnosis Date  . Pain in right shoulder   . Thoracic outlet syndrome   . Shoulder impingement syndrome   . Adhesive capsulitis of shoulder   . History of  migraine headaches   . Kidney stones    Past Surgical History  Procedure Laterality Date  . Shoulder arthroscopy Right 12/2012, 07-13-2013  . Cesarean section    . Nephrostomy    . Ureteral stent placement     Family History  Problem Relation Age of Onset  . Heart disease Mother   . Hyperlipidemia Mother   . Varicose Veins Mother    Social History  Substance Use Topics  . Smoking status: Former Smoker    Types: Cigarettes    Quit date: 12/30/2005  . Smokeless tobacco: None  . Alcohol Use: Yes     Comment: occassional glass of wine    OB History    No data available     Review of Systems  Constitutional: Negative for fever and chills.  Respiratory: Negative for shortness of breath.   Cardiovascular: Negative for chest pain.  Musculoskeletal: Positive for arthralgias (left knee, left thigh). Negative for back pain.  Neurological: Negative for numbness.    Allergies  Codeine; Darvocet a500; Dilaudid; Latex; Macrobid; Morphine sulfate; Penicillins; and Sulfa antibiotics  Home Medications   Prior to Admission medications   Not on File   BP 134/83 mmHg  Pulse 62  Temp(Src) 97.7 F (36.5 C) (Oral)  Resp 18  SpO2 99%  LMP 03/21/2016 Physical Exam  Constitutional: She is oriented to person, place, and time. She appears well-developed and well-nourished. No distress.  HENT:  Head: Normocephalic and atraumatic.  Eyes: Conjunctivae and EOM are normal.  Neck: Neck supple. No tracheal deviation present.  Cardiovascular: Normal rate.   Pulmonary/Chest: Effort normal. No respiratory distress.  Musculoskeletal: Normal range of motion.  Thigh compartment both legs are soft Faint blanchable reticular erythema noted to L anterior thigh, no exudate, induration or fluctuance. Left medial anterior thigh nontender to palpation Bilateral lower extremities without palpaple cords, no erythema or edema Dorsalis pedis pulse palpable Brisk cap refill to toes bilaterally Normal  hip flexion, extension, abduction, and adduction Normal reflexion and extension left side Left knee with normal Varus and Valgus Manuever Negative anterior and posterior Drawer Test 5/5 strength to bilateral lower extremities Reticular erythema presentation is blanchable, no obvious sign of infection No significant leg swelling  Neurological: She is alert and oriented to person, place, and time.  Skin: Skin is warm and dry.  Psychiatric: She has a normal mood and affect. Her behavior is normal.  Nursing note and vitals reviewed.   ED Course  Procedures (including critical care time)  DIAGNOSTIC STUDIES: Oxygen Saturation is 99% on RA, normal by my interpretation.    COORDINATION OF CARE: 2:08 PM Will order DG FemurDiscussed treatment plan with pt at bedside and pt agreed to plan.  Imaging Review Dg Femur Min 2 Views Left  04/21/2016  CLINICAL DATA:  LEFT knee pain EXAM: LEFT FEMUR 2 VIEWS COMPARISON:  None. FINDINGS: No fracture dislocation of the LEFT femur IMPRESSION: No fracture or dislocation. Electronically Signed   By: Genevive Bi M.D.   On: 04/21/2016 14:43   I have personally reviewed and evaluated these images and lab results as part of my medical decision-making.   MDM   Final diagnoses:  Left thigh pain   BP 134/83 mmHg  Pulse 62  Temp(Src) 97.7 F (36.5 C) (Oral)  Resp 18  SpO2 99%  LMP 03/25/2016  I personally performed the services described in this documentation, which was scribed in my presence. The recorded information has been reviewed and is accurate.     2:40 PM Pt who is an avid runner presenting with L thigh pain, worsening with running and resolved with resting.  Was diagnosed with L patella tendinitis several weeks ago which has since improved.  She is concern due to the presence of redness to her L anterior thigh.  It appears in a reticular pattern but does not resembles cellulitis or infection.  No reproducible pain on exam.  She does not  have any significant risk factors for PE/DVT.  There's a potential of stress fracture involving her L femur due to her activity level.  Xray of L femur obtain.  I did offer to order a vascular study to r/o DVT but pt declined stating she does not think she needs one.  She is otherwise NVI.  No back pain, no sign to suggest radicular pain.  Low suspicion for rhabdo.  2:54 PM Xray of L femur without acute finding.  Pt is scheduled to see Dr. Rennis Chris tomorrow morning.  I recommend avoid exercise in the mean time.  I also encourage pt to have a vascular doppler study by her orthopedist if deem appropriate to r/o DVT.  Return precaution discussed.    Fayrene Helper, PA-C 04/21/16 1456  Lavera Guise, MD 04/22/16 1150

## 2016-04-21 NOTE — ED Notes (Signed)
PT DISCHARGED. INSTRUCTIONS GIVEN. AAOX3. PT IN NO APPARENT DISTRESS. THE OPPORTUNITY TO ASK QUESTIONS WAS PROVIDED. 

## 2016-04-21 NOTE — Discharge Instructions (Signed)
Although your pain may be related to muscle soreness from prolonged running, please follow up with your orthopedist tomorrow.  Your orthopedist may consider evaluating for potential blood clots in your leg if he deem appropriate.  Take tylenol or ibuprofen at home for pain.  Return to the ER if you develop worsening symptoms, having chest pain, shortness of breath, or fever.   Pain Without a Known Cause WHAT IS PAIN WITHOUT A KNOWN CAUSE? Pain can occur in any part of the body and can range from mild to severe. Sometimes no cause can be found for why you are having pain. Some types of pain that can occur without a known cause include:   Headache.  Back pain.  Abdominal pain.  Neck pain. HOW IS PAIN WITHOUT A KNOWN CAUSE DIAGNOSED?  Your health care provider will try to find the cause of your pain. This may include:  Physical exam.  Medical history.  Blood tests.  Urine tests.  X-rays. If no cause is found, your health care provider may diagnose you with pain without a known cause.  IS THERE TREATMENT FOR PAIN WITHOUT A CAUSE?  Treatment depends on the kind of pain you have. Your health care provider may prescribe medicines to help relieve your pain.  WHAT CAN I DO AT HOME FOR MY PAIN?   Take medicines only as directed by your health care provider.  Stop any activities that cause pain. During periods of severe pain, bed rest may help.  Try to reduce your stress with activities such as yoga or meditation. Talk to your health care provider for other stress-reducing activity recommendations.  Exercise regularly, if approved by your health care provider.  Eat a healthy diet that includes fruits and vegetables. This may improve pain. Talk to your health care provider if you have any questions about your diet. WHAT IF MY PAIN DOES NOT GET BETTER?  If you have a painful condition and no reason can be found for the pain or the pain gets worse, it is important to follow up with your  health care provider. It may be necessary to repeat tests and look further for a possible cause.    This information is not intended to replace advice given to you by your health care provider. Make sure you discuss any questions you have with your health care provider.   Document Released: 09/10/2001 Document Revised: 01/06/2015 Document Reviewed: 05/03/2014 Elsevier Interactive Patient Education [employer]2016 Elsevier Inc.

## 2016-04-21 NOTE — ED Notes (Addendum)
Pt complains of increased pain and redness to her left thigh since 10AM today, pt has had left thigh pain since Monday. Pt states the increased pain started after using a heating pad on her thigh. Pt states she runs 4 days a week. Pt states the pain is worse when running. Pt states she has been wearing a compression knee brace on her left knee while she runs since last Monday. Pt states she been resting for 2 weeks until last Monday due to patellar tendon injury.

## 2016-04-26 ENCOUNTER — Other Ambulatory Visit (HOSPITAL_COMMUNITY): Payer: Self-pay | Admitting: Orthopedic Surgery

## 2016-04-26 DIAGNOSIS — M84352A Stress fracture, left femur, initial encounter for fracture: Secondary | ICD-10-CM

## 2016-04-30 ENCOUNTER — Encounter (HOSPITAL_COMMUNITY): Payer: Self-pay

## 2016-04-30 ENCOUNTER — Encounter (HOSPITAL_COMMUNITY)
Admission: RE | Admit: 2016-04-30 | Discharge: 2016-04-30 | Disposition: A | Discharge status: Home / Self Care | Payer: 59 | Source: Ambulatory Visit | Attending: Orthopedic Surgery | Admitting: Orthopedic Surgery

## 2016-04-30 DIAGNOSIS — M84352A Stress fracture, left femur, initial encounter for fracture: Secondary | ICD-10-CM

## 2016-04-30 MED ORDER — TECHNETIUM TC 99M MEDRONATE IV KIT
25.0000 | PACK | Freq: Once | INTRAVENOUS | Status: AC | PRN
  Administered 2016-04-30: 25 via INTRAVENOUS

## 2019-03-17 ENCOUNTER — Other Ambulatory Visit: Payer: Self-pay

## 2019-03-17 ENCOUNTER — Ambulatory Visit (INDEPENDENT_AMBULATORY_CARE_PROVIDER_SITE_OTHER): Payer: 59 | Admitting: Sports Medicine

## 2019-03-17 ENCOUNTER — Encounter: Payer: Self-pay | Admitting: Sports Medicine

## 2019-03-17 VITALS — BP 118/74 | Ht 66.0 in | Wt 107.0 lb

## 2019-03-17 DIAGNOSIS — M84353A Stress fracture, unspecified femur, initial encounter for fracture: Secondary | ICD-10-CM

## 2019-03-17 NOTE — Progress Notes (Addendum)
Brief note:  Patient was seen and managed by Dr. Farris Has at [facility].  She was diagnosed with a femoral neck compression side stress fracture of the left hip.  This was diagnosed March 02, 2019.  She subsequently ran a full marathon in [place] on March 06, 2019.  She tolerated the race although has been having ongoing groin pain.  Dr. Farris Has did reevaluate her to confirm no worsening injury.  She presented today per referral by Dr. Farris Has for gait analysis.  Unfortunately, given that she currently has a femoral neck stress fracture, I do not think it is appropriate to do this analysis.  I am recommending that she be rescheduled for the next 4 to 6 weeks when she is pain-free and is no longer limping after activity.  I was the preceptor for this visit and available for consultation.  I absolutely agree that we should wait on gait analysis at this time. Marsa Aris, DO

## 2019-11-02 ENCOUNTER — Ambulatory Visit (INDEPENDENT_AMBULATORY_CARE_PROVIDER_SITE_OTHER): Payer: Managed Care, Other (non HMO) | Admitting: Sports Medicine

## 2019-11-02 ENCOUNTER — Other Ambulatory Visit: Payer: Self-pay

## 2019-11-02 VITALS — BP 112/74 | Ht 66.0 in | Wt 106.0 lb

## 2019-11-02 DIAGNOSIS — M216X2 Other acquired deformities of left foot: Secondary | ICD-10-CM | POA: Diagnosis not present

## 2019-11-02 DIAGNOSIS — M216X1 Other acquired deformities of right foot: Secondary | ICD-10-CM

## 2019-11-02 NOTE — Patient Instructions (Addendum)
Dr. Iver Nestle Registered [place], Family Medicine [phone]

## 2019-11-03 ENCOUNTER — Encounter: Payer: Self-pay | Admitting: Sports Medicine

## 2019-11-03 NOTE — Progress Notes (Signed)
   Subjective:    Patient ID: Amanda [name], female    DOB: [birth year], 38 y.o.   MRN: [id number]  HPI chief complaint: Gait analysis  Very pleasant active 38 year old runner comes in today for a gait evaluation per Dr. Noemi Chapel.  She has a history of a previous left hip stress fracture and a left femoral shaft stress fracture.  She initially presented to our office earlier this year but was still being treated for her stress fracture.  We were unable to do a gait analysis at that time.  She has now resumed running and is slowly increasing her volume.  She has run and several half marathons and has completed 1 marathon but was injured during that race.  She questions whether or not her gait is contributing to her reoccurring stress fractures.  She has seen a nutritionist in the past and has had blood work to rule out calcium and vitamin D deficiency.  Past medical history reviewed Occasions reviewed Allergies reviewed  Review of Systems As above    Objective:   Physical Exam  Well-developed, fit appearing.  No acute distress.  Awake alert and oriented x3.  Vital signs reviewed  Examination of the patient's feet in the standing position shows a fairly neutral longitudinal arch.  She does have a small tailor's bunion on the right. Evaluation of her running gait shows excellent form with moderate pronation.  No limp.      Assessment & Plan:  Pronation History of left femoral neck stress fracture  Custom orthotics were created for this patient today.  She found them to be comfortable prior to leaving the office.  Although I did not check her hip strength today, I did explain to her that hip abductor weakness can also play a role and reoccurring injuries in runners.  She does admit that she has been told this before and will make a better effort to strengthen her hip abductor muscles.  I also recommended that she see a local nutritionist.  Even though her blood work previously was normal, I am  highly suspicious that one of the reasons she continues to get injured is relative malnutrition given her activity level.  Her BMI is low (17) would also suggest a relative energy deficiency.  We provided her with the name of Iver Nestle and I encouraged her to contact her office for an appointment.  Follow-up with [place] as needed.  Patient was fitted for a : standard, cushioned, semi-rigid orthotic. The orthotic was heated and afterward the patient stood on the orthotic blank positioned on the orthotic stand. The patient was positioned in subtalar neutral position and 10 degrees of ankle dorsiflexion in a weight bearing stance. After completion of molding, a stable base was applied to the orthotic blank. The blank was ground to a stable position for weight bearing. Size: 8 Base: Blue EVA Posting: none Additional orthotic padding: none

## 2021-04-18 ENCOUNTER — Telehealth: Payer: Self-pay | Admitting: Family Medicine

## 2021-04-18 NOTE — Telephone Encounter (Signed)
Got pt scheduled and informed her to bring labwork

## 2021-04-18 NOTE — Telephone Encounter (Signed)
Pt called and said she was recommended by your Chiropractor and wants to see if you would be willing to take her on as a new pt. She sated she had some labs done recently and her blood work was abnormal. Wanted to get clearance from you first.

## 2021-04-18 NOTE — Telephone Encounter (Signed)
You can put her in for a 30 min visit tomorrow (assuming we take her insurance). Be sure she brings her labwork

## 2021-04-18 NOTE — Progress Notes (Addendum)
Chief Complaint  Patient presents with  . New Patient (Initial Visit)    New patient to establish and follow up on labs. Has been having hand problems, skin related. Red rash and swelling-saw derm. Steroid cream was given. Now she has joint pain with the skin splitting and hand coldness. Derm did further labs and she went to rheum. Saw [place] [facility]-PA there and she said she was going to do some labs, perhaps autoimmune issue. Was put on meloxicam x 2 weeks-and was told she was anemic. She is vegan. Runs 30 miles a week. Runs a farm and boards horses. Ordered through [employer], [place]  . Shortness of Breath    Has SOB, migraine, facial numbness and fatigue by the end of the day. 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 She is feeling worse and worse and is concerned. Runs at 5am, but by the end of the day is wiped out.     Patient presents for evaluation of iron deficiency anemia. This was first detected in March, when labs were done by [facility]. She subsequently had additional labs done through [facility]--she looked up and ordered labs related to her being vegan.  She brings these with her today, notable only for iron deficiency anemia, others normal (see below).  2 winters ago her hands got red, swollen, like an allergic reaction. Derm diagnosed with eczema, treated with steroid.  She reports that this recurred sooner and worse this winter--cracking, bleeding.  Joints hurt (wrists, fingers), cold-sensitive. Derm gave a different steroid cream, didn't help. Derm checked labs--per patient had ANA, RF, ESR, all reportedly normal.  She was treated with a steroid shot and prednisone and improved in 24-48 hours. She was sent to [facility] and saw a PA there in 02/2021.  Had x-rays, trial of meloxicam x 2 weeks.  Checked CBC, reported Hg of 8.   Denies Raynaud's.  She runs at 5:30am 5 days/week. She has no dyspnea on exertion, chest pain, not aware of any change in heart rate, feels like her typical runs, no  problems. 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  By the end of the day she reports fatigue, shortness of breath. She notes feeling out of breath just walking the dog.  She started ferrous sulfate every other day 2 weeks ago (she reports this is what the pharmacist recommended). She has some nausea. She has some constipation, is taking colace which helps.  Denies blood in stool, abnormal vaginal bleeding (periods are regular, not heavy, maybe just on day 2 of cycle). Denies blood in urine, nosebleeds, bruising, or other other bleeding.  She describes an episode that occurred this past Friday--started with an ocular migraine, then developed facial numbness (same side as her headache, later went into the arm). Numbness lasted 5 minutes, resolved on its own.   PMH, PSH, SH and FH were all reviewed and chart updated.  Outpatient Encounter Medications as of 04/19/2021  Medication Sig Note  . Cyanocobalamin (VITAMIN B12 PO) Take 3,000 mcg by mouth daily.   . ferrous sulfate 324 (65 Fe) MG TBEC Take 1 tablet by mouth every other day. 04/19/2021: For the last 2 weeks  . [DISCONTINUED] cholecalciferol (VITAMIN D3) 25 MCG (1000 UT) tablet Take 2,000 Units by mouth daily. (Patient not taking: Reported on 04/19/2021)    No facility-administered encounter medications on file as of 04/19/2021.   Allergies  Allergen Reactions  .  Codeine   . Darvocet A500 [Propoxyphene N-Acetaminophen]   . Dilaudid [Hydromorphone Hcl]   . Latex   . Macrobid [Nitrofurantoin Macrocrystal]   . Morphine Sulfate   . Sulfa Antibiotics    ROS: no fever, chills, URI symptoms, chest pain.  +headache Friday, with short-lived associated numbness/tingling as per HPI. Currently without any neurologic symptoms.  She reports she is tired all the time, cold all the time, hair loss. Constipation since starting iron.  Didn't check thyroid with recent labs and would  like this checked. No bleeding, bruising.  Skin rash on hands is improved.   She gives h/o 2 stress fractures at L femur (kept running--marathon and 1/2 marathon despite knowing of these stress fractures). Moods are good.  Some nausea from iron. No vomiting. Normal appetite, no weight changes.   PHYSICAL EXAM:  BP 104/60   Pulse 60   Ht 5' 5.5" (1.664 m)   Wt 105 lb 3.2 oz (47.7 kg)   LMP 03/19/2021 (Exact Date)   BMI 17.24 kg/m   Well developed, well nourished, thin female, in no distress HEENT: conjunctiva and sclera are clear, EOMI. Wearing mask due to COVID-19 pandemic. Reviewed briefly--oral mucosa is somewhat pale, otherwise normal. Neck: No lymphadenopathy or thyromegaly, no carotid bruit Heart:  Regular rate and rhythm, no murmurs, rubs, gallops or ectopy Lungs:  Clear bilaterally, without wheezes, rales or ronchi Abdomen:  Soft, nontender, nondistended, no hepatosplenomegaly or masses, normal bowel sounds Extremities:  No clubbing, cyanosis or edema, 2+ pulses.  Neuro:  Alert and oriented x 3.  DTR's 2+ and symmetric.  Normal strength and sensation, normal gait. Back:  No spine or CVA tenderness Skin: no rashes or suspicious lesions. Slightly pink over MCP's at index fingers. No significant erythema, swelling, no warmth.  Skin intact, no fissuring or rashes. Psych:  Normal mood, affect, hygiene and grooming, normal speech, eye contact  Labs brought in by patient:  CBC: Hg 8.8, Hct 31, plt 341, WBC 4.9, MCV 67 Iron 13, ferritin 2 B12 560, normal folate. Vit D-OH 34.2 Normal homocystine, Ca, zinc.    ASSESSMENT/PLAN:  Iron deficiency anemia, unspecified iron deficiency anemia type - no hx of bleeding, cycles are light. Increase iron dose, refer to GI. Suspect very gradual loss. If not responding to oral Fe, may need infusion - Plan: Ambulatory referral to Gastroenterology, CBC with Differential/Platelet, Ferritin, Iron  Fatigue, unspecified type - due to IDA. Given hair  loss, will also check thyroid.  - Plan: TSH  Hair loss - Plan: TSH  Vegan diet  I spent 50 minutes dedicated to the care of this patient, including pre-visit review of records, face to face time, post-visit ordering of testing and documentation.    Increase your iron to three times daily with meals. If constipation is an issue, start mirlalax daily.  If you cannot tolerate the iron, you can try Niferex (144m, also over the counter), versus scheduling you for an iron infusion.  We are referring you to a gastroenterologist for further evaluation of why you might be anemic.

## 2021-04-19 ENCOUNTER — Encounter: Payer: Self-pay | Admitting: Family Medicine

## 2021-04-19 ENCOUNTER — Ambulatory Visit (INDEPENDENT_AMBULATORY_CARE_PROVIDER_SITE_OTHER): Payer: Commercial Managed Care - PPO | Admitting: Family Medicine

## 2021-04-19 ENCOUNTER — Other Ambulatory Visit: Payer: Self-pay

## 2021-04-19 VITALS — BP 104/60 | HR 60 | Ht 65.5 in | Wt 105.2 lb

## 2021-04-19 DIAGNOSIS — D509 Iron deficiency anemia, unspecified: Secondary | ICD-10-CM | POA: Diagnosis not present

## 2021-04-19 DIAGNOSIS — Z789 Other specified health status: Secondary | ICD-10-CM | POA: Diagnosis not present

## 2021-04-19 DIAGNOSIS — R5383 Other fatigue: Secondary | ICD-10-CM | POA: Diagnosis not present

## 2021-04-19 DIAGNOSIS — L659 Nonscarring hair loss, unspecified: Secondary | ICD-10-CM

## 2021-04-19 LAB — IRON

## 2021-04-19 LAB — CBC WITH DIFFERENTIAL/PLATELET
Basos: 1 %
Immature Granulocytes: 0 %
Neutrophils: 53 %
RDW: 20.5 % — ABNORMAL HIGH (ref 11.7–15.4)

## 2021-04-19 LAB — TSH

## 2021-04-19 NOTE — Patient Instructions (Addendum)
Increase your iron to three times daily with meals. If constipation is an issue, start mirlalax daily.  If you cannot tolerate the iron, you can try Niferex (150mg , also over the counter), versus scheduling you for an iron infusion.  We are referring you to a gastroenterologist for further evaluation of why you might be anemic.  Iron-Rich Diet  Iron is a mineral that helps your body to produce hemoglobin. Hemoglobin is a protein in red blood cells that carries oxygen to your body's tissues. Eating too little iron may cause you to feel weak and tired, and it can increase your risk of infection. Iron is naturally found in many foods, and many foods have iron added to them (iron-fortified foods). You may need to follow an iron-rich diet if you do not have enough iron in your body due to certain medical conditions. The amount of iron that you need each day depends on your age, your sex, and any medical conditions you have. Follow instructions from your health care provider or a diet and nutrition specialist (dietitian) about how much iron you should eat each day. What are tips for following this plan? Reading food labels  Check food labels to see how many milligrams (mg) of iron are in each serving. Cooking  Cook foods in pots and pans that are made from iron.  Take these steps to make it easier for your body to absorb iron from certain foods: ? Soak beans overnight before cooking. ? Soak whole grains overnight and drain them before using. ? Ferment flours before baking, such as by using yeast in bread dough. Meal planning  When you eat foods that contain iron, you should eat them with foods that are high in vitamin C. These include oranges, peppers, tomatoes, potatoes, and mango. Vitamin C helps your body to absorb iron. General information  Take iron supplements only as told by your health care provider. An overdose of iron can be life-threatening. If you were prescribed iron supplements,  take them with orange juice or a vitamin C supplement.  When you eat iron-fortified foods or take an iron supplement, you should also eat foods that naturally contain iron, such as meat, poultry, and fish. Eating naturally iron-rich foods helps your body to absorb the iron that is added to other foods or contained in a supplement.  Certain foods and drinks prevent your body from absorbing iron properly. Avoid eating these foods in the same meal as iron-rich foods or with iron supplements. These foods include: ? Coffee, black tea, and red wine. ? Milk, dairy products, and foods that are high in calcium. ? Beans and soybeans. ? Whole grains. What foods should I eat? Fruits Prunes. Raisins. Eat fruits high in vitamin C, such as oranges, grapefruits, and strawberries, alongside iron-rich foods. Vegetables Spinach (cooked). Green peas. Broccoli. Fermented vegetables. Eat vegetables high in vitamin C, such as leafy greens, potatoes, bell peppers, and tomatoes, alongside iron-rich foods. Grains Iron-fortified breakfast cereal. Iron-fortified whole-wheat bread. Enriched rice. Sprouted grains. Meats and other proteins Beef liver. Oysters. Beef. Shrimp. . Chicken. Tuna. Sardines. Chickpeas. Nuts. Tofu. Pumpkin seeds. Beverages Tomato juice. Fresh orange juice. Prune juice. Hibiscus tea. Fortified instant breakfast shakes. Sweets and desserts Blackstrap molasses. Seasonings and condiments Tahini. Fermented soy sauce. Other foods Wheat germ. The items listed above may not be a complete list of recommended foods and beverages. Contact a dietitian for more information. What foods should I avoid? Grains Whole grains. Bran cereal. Bran flour. Oats. Meats and other  proteins Soybeans. Products made from soy protein. Black beans. Lentils. Mung beans. Split peas. Dairy Milk. Cream. Cheese. Yogurt. Cottage cheese. Beverages Coffee. Black tea. Red wine. Sweets and desserts Cocoa. Chocolate.  Ice cream. Other foods Basil. Oregano. Large amounts of parsley. The items listed above may not be a complete list of foods and beverages to avoid. Contact a dietitian for more information. Summary  Iron is a mineral that helps your body to produce hemoglobin. Hemoglobin is a protein in red blood cells that carries oxygen to your body's tissues.  Iron is naturally found in many foods, and many foods have iron added to them (iron-fortified foods).  When you eat foods that contain iron, you should eat them with foods that are high in vitamin C. Vitamin C helps your body to absorb iron.  Certain foods and drinks prevent your body from absorbing iron properly, such as whole grains and dairy products. You should avoid eating these foods in the same meal as iron-rich foods or with iron supplements. This information is not intended to replace advice given to you by your health care provider. Make sure you discuss any questions you have with your health care provider. Document Revised: 11/28/2017 Document Reviewed: 11/11/2017 Elsevier Patient Education  2021 [employer].

## 2021-04-20 LAB — CBC WITH DIFFERENTIAL/PLATELET
Basophils Absolute: 0.1 10*3/uL (ref 0.0–0.2)
EOS (ABSOLUTE): 0.1 10*3/uL (ref 0.0–0.4)
Eos: 2 %
Hematocrit: 30.7 % — ABNORMAL LOW (ref 34.0–46.6)
Hemoglobin: 8.7 g/dL — ABNORMAL LOW (ref 11.1–15.9)
Immature Grans (Abs): 0 10*3/uL (ref 0.0–0.1)
Lymphocytes Absolute: 1.5 10*3/uL (ref 0.7–3.1)
Lymphs: 31 %
MCH: 19.3 pg — ABNORMAL LOW (ref 26.6–33.0)
MCHC: 28.3 g/dL — ABNORMAL LOW (ref 31.5–35.7)
MCV: 68 fL — ABNORMAL LOW (ref 79–97)
Monocytes Absolute: 0.6 10*3/uL (ref 0.1–0.9)
Monocytes: 13 %
Neutrophils Absolute: 2.4 10*3/uL (ref 1.4–7.0)
Platelets: 290 10*3/uL (ref 150–450)
RBC: 4.51 x10E6/uL (ref 3.77–5.28)
WBC: 4.7 10*3/uL (ref 3.4–10.8)

## 2021-04-20 LAB — FERRITIN: Ferritin: 4 ng/mL — ABNORMAL LOW (ref 15–150)

## 2021-04-21 ENCOUNTER — Encounter: Payer: Self-pay | Admitting: Family Medicine

## 2021-04-21 NOTE — Addendum Note (Signed)
Addended by: [name] on: 04/21/2021 09:30 AM   Modules accepted: Level of Service

## 2021-04-23 ENCOUNTER — Encounter: Payer: Self-pay | Admitting: Internal Medicine

## 2021-04-23 ENCOUNTER — Encounter: Payer: Self-pay | Admitting: Family Medicine

## 2021-04-25 ENCOUNTER — Encounter: Payer: Self-pay | Admitting: Family Medicine

## 2021-05-04 ENCOUNTER — Encounter: Payer: Self-pay | Admitting: *Deleted

## 2021-05-08 ENCOUNTER — Telehealth: Payer: Self-pay | Admitting: Pharmacy Technician

## 2021-05-08 ENCOUNTER — Other Ambulatory Visit: Payer: Self-pay | Admitting: Internal Medicine

## 2021-05-08 ENCOUNTER — Ambulatory Visit (INDEPENDENT_AMBULATORY_CARE_PROVIDER_SITE_OTHER): Payer: Commercial Managed Care - PPO | Admitting: Internal Medicine

## 2021-05-08 ENCOUNTER — Other Ambulatory Visit: Payer: Self-pay

## 2021-05-08 ENCOUNTER — Encounter: Payer: Self-pay | Admitting: Internal Medicine

## 2021-05-08 ENCOUNTER — Other Ambulatory Visit (INDEPENDENT_AMBULATORY_CARE_PROVIDER_SITE_OTHER): Payer: Commercial Managed Care - PPO

## 2021-05-08 VITALS — BP 110/72 | HR 68 | Ht 65.5 in | Wt 107.0 lb

## 2021-05-08 DIAGNOSIS — D509 Iron deficiency anemia, unspecified: Secondary | ICD-10-CM

## 2021-05-08 LAB — COMPREHENSIVE METABOLIC PANEL
ALT: 26 U/L (ref 0–35)
AST: 33 U/L (ref 0–37)
Albumin: 4.9 g/dL (ref 3.5–5.2)
Alkaline Phosphatase: 45 U/L (ref 39–117)
BUN: 12 mg/dL (ref 6–23)
CO2: 28 mEq/L (ref 19–32)
Calcium: 9.9 mg/dL (ref 8.4–10.5)
Chloride: 102 mEq/L (ref 96–112)
Creatinine, Ser: 0.66 mg/dL (ref 0.40–1.20)
GFR: 110.35 mL/min (ref >60.00)
Glucose, Bld: 88 mg/dL (ref 70–99)
Potassium: 3.9 mEq/L (ref 3.5–5.1)
Sodium: 138 mEq/L (ref 135–145)
Total Bilirubin: 0.6 mg/dL (ref 0.2–1.2)
Total Protein: 7.5 g/dL (ref 6.0–8.3)

## 2021-05-08 LAB — CBC WITH DIFFERENTIAL/PLATELET
Basophils Absolute: 0 10*3/uL (ref 0.0–0.1)
Basophils Relative: 1 % (ref 0.0–3.0)
Eosinophils Absolute: 0.1 10*3/uL (ref 0.0–0.7)
Eosinophils Relative: 1.2 % (ref 0.0–5.0)
HCT: 31.2 % — ABNORMAL LOW (ref 36.0–46.0)
Hemoglobin: 9.7 g/dL — ABNORMAL LOW (ref 12.0–15.0)
Lymphocytes Relative: 25 % (ref 12.0–46.0)
Lymphs Abs: 1.1 10*3/uL (ref 0.7–4.0)
MCHC: 31 g/dL (ref 30.0–36.0)
MCV: 64.8 fl — ABNORMAL LOW (ref 78.0–100.0)
Monocytes Absolute: 0.3 10*3/uL (ref 0.1–1.0)
Monocytes Relative: 5.8 % (ref 3.0–12.0)
Neutro Abs: 3 10*3/uL (ref 1.4–7.7)
Neutrophils Relative %: 67 % (ref 43.0–77.0)
Platelets: 323 10*3/uL (ref 150.0–400.0)
RBC: 4.81 Mil/uL (ref 3.87–5.11)
RDW: 24.2 % — ABNORMAL HIGH (ref 11.5–15.5)
WBC: 4.5 10*3/uL (ref 4.0–10.5)

## 2021-05-08 LAB — IBC + FERRITIN
Ferritin: 2.5 ng/mL — ABNORMAL LOW (ref 10.0–291.0)
Iron: 14 ug/dL — ABNORMAL LOW (ref 42–145)
Saturation Ratios: 2.4 % — ABNORMAL LOW (ref 20.0–50.0)
Transferrin: 421 mg/dL — ABNORMAL HIGH (ref 212.0–360.0)

## 2021-05-08 MED ORDER — SUTAB 1479-225-188 MG PO TABS: ORAL_TABLET | ORAL | 0 refills | Status: DC

## 2021-05-08 NOTE — Patient Instructions (Signed)
Your provider has requested that you go to the basement level for lab work before leaving today. Press "B" on the elevator. The lab is located at the first door on the left as you exit the elevator.  You have been scheduled for an endoscopy and colonoscopy. Please follow the written instructions given to you at your visit today. Please pick up your prep supplies at the pharmacy within the next 1-3 days. If you use inhalers (even only as needed), please bring them with you on the day of your procedure.  You will be scheduled for IV iron.   Discontinue your oral iron supplement.  If you are age 64 or younger, your body mass index should be between 19-25. Your Body mass index is 17.53 kg/m. If this is out of the aformentioned range listed, please consider follow up with your Primary Care Provider.   Due to recent changes in healthcare laws, you may see the results of your imaging and laboratory studies on MyChart before your provider has had a chance to review them.  We understand that in some cases there may be results that are confusing or concerning to you. Not all laboratory results come back in the same time frame and the provider may be waiting for multiple results in order to interpret others.  Please give [place] 48 hours in order for your provider to thoroughly review all the results before contacting the office for clarification of your results.

## 2021-05-08 NOTE — Telephone Encounter (Incomplete)
Dr. Aundra Millet Submission: Approved/NO AUTH NECESSARY Payer: [employer] covers 80% after the patient has met the deductible of 3000$ Medication & CPT/J Code(s) submitted: Venofer (Iron Sucrose) J1756 Route of submission (phone, fax, portal): PHONE:  {type of PA:25519::"Buy/Bill"} Units/visits requested: 2 Reference number: [id number]

## 2021-05-08 NOTE — Telephone Encounter (Signed)
Dr. Aundra Millet Submission: Approved/NO AUTH NECESSARY Payer: [employer]HC covers 80% after the patient has met the deductible of 3000$ Medication & CPT/J Code(s) submitted: Venofer (Iron Sucrose) J1756 Route of submission (phone, fax, portal): PHONE:  Buy/Bill Units/visits requested: 2 Reference number: [id number]

## 2021-05-08 NOTE — Telephone Encounter (Signed)
Dr. Bradley Ferris Submission: DENIED Payer: [employer] Medication & CPT/J Code(s) submitted: Feraheme (ferumoxytol) [phone] Route of submission (phone, fax, portal):  PHONE: [phone]  DENIED: due to patient have not tried any of the following. VENOFER, INFED, or VENOFER.  Can we start VENOFER?? Please Advise???  Selena Batten

## 2021-05-08 NOTE — Progress Notes (Signed)
Patient ID: [name], female   DOB: 03/07/1981, 40 y.o.   MRN: [id number] HPI: Amanda [name] is a 40 year old female with little past medical history who is seen in consultation at the request of Amanda Arrow, MD to evaluate iron deficiency anemia.  She is here alone today.  She reports that she recently established with primary care and was noted to have severe iron deficiency anemia.  This was a new problem for her though she does maintain a vegan diet.  She is very active and an endurance athlete.  She runs currently 30 miles per week but is also training for an ultramarathon and has run several previous marathons.  She has noticed some shortness of breath with exertions, more fatigue and also lack of focus.  She feels that there is a cognitive aspect to her low iron.  She has confused some of her appointment times and she feels that this is related.  She denies specific GI symptoms.  She seen no blood in her stool or melena.  The oral iron has caused GI upset as well as constipation.  Iron was recently increased to 3 times per day and she had more GI upset with constipation and then a single day of significant and somewhat explosive diarrhea.  Stools dark on iron but again no black or tarry stools proceeding oral iron therapy.  Her appetite has never been very good but she attributes this to her exercise regimen.  No heartburn, dysphagia or odynophagia symptoms.  No ongoing nausea, vomiting or early satiety.  Menstrual periods are regular though her last cycle was 10 to 12 days late.  Her menstrual cycles are not overly heavy.  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  She is married and has one 33 year old daughter.  No alcohol.  No tobacco.  No family history of celiac disease or GI tract malignancy.  No family history of IBD.  Past Medical History:  Diagnosis Date  . Adhesive capsulitis of shoulder   . Childhood asthma   . History of migraine headaches     started age 81, milder with age. some ocular migraines  . Hypercalcemia    noted as cause for kidney stones during pregnancy  . IDA (iron deficiency anemia)   . Kidney stones    during pregnancy with daughter; under care of [facility]. Calcium Oxalate  . Pain in right shoulder   . Placental abruption    chronic, with pregnancy; delivered at 24wk 6 d, lived 6 days, emergency c-section  . Shoulder impingement syndrome   . Stress fracture of femur    x 2 per patient  . Thoracic outlet syndrome   . Vegan diet     Past Surgical History:  Procedure Laterality Date  . CESAREAN SECTION    . NEPHROSTOMY    . SHOULDER ARTHROSCOPY Right 12/2012, 07-13-2013  . URETERAL STENT PLACEMENT      Outpatient Medications Prior to Visit  Medication Sig Dispense Refill  . Cyanocobalamin (VITAMIN B12 PO) Take 3,000 mcg by mouth daily.    . polyethylene glycol (MIRALAX / GLYCOLAX) 17 g packet Take 17 g by mouth as needed. I capful every other day    . ferrous sulfate 324 (65 Fe) MG TBEC Take 1 tablet by mouth every other day.    . meloxicam (MOBIC) 15 MG tablet Take 1 tablet by mouth daily. (Patient not taking: Reported on 05/08/2021)     No facility-administered medications  prior to visit.    Allergies  Allergen Reactions  . Cephalexin Swelling  . Codeine   . Darvocet A500 [Propoxyphene N-Acetaminophen]   . Dilaudid [Hydromorphone Hcl]   . Latex   . Macrobid [Nitrofurantoin Macrocrystal]   . Morphine Sulfate   . Sulfa Antibiotics     Family History  Problem Relation Age of Onset  . Hyperlipidemia Mother   . Hypothyroidism Mother   . Allergies Daughter   . Hyperthyroidism Maternal Uncle   . Hyperthyroidism Maternal Grandmother   . Heart disease Maternal Grandmother        died 4 from MI  . Heart disease Maternal Grandfather        CABG   . Cancer Neg Hx   . Colon cancer Neg Hx   . Pancreatic cancer Neg Hx   . Esophageal cancer Neg Hx     Social History   Tobacco Use  .  Smoking status: Former Smoker    Types: Cigarettes    Quit date: 12/30/2000    Years since quitting: 20.3  . Smokeless tobacco: Never Used  . Tobacco comment: occasional use during college  Vaping Use  . Vaping Use: Never used  Substance Use Topics  . Alcohol use: Not Currently  . Drug use: No    ROS: As per history of present illness, otherwise negative  BP 110/72   Pulse 68   Ht 5' 5.5" (1.664 m)   Wt 107 lb (48.5 kg)   SpO2 99%   BMI 17.53 kg/m  Constitutional: Well-developed and well-nourished. No distress. HEENT: Normocephalic and atraumatic. No scleral icterus.  Conjunctivae pale Cardiovascular: Normal rate, regular rhythm and intact distal pulses. No M/R/G Pulmonary/chest: Effort normal and breath sounds normal. No wheezing, rales or rhonchi. Abdominal: Soft, nontender, nondistended. Bowel sounds active throughout.  There is a fullness in the left lower abdomen overlying the palpable abdominal aorta which likely represents stool in the sigmoid, no hepatosplenomegaly. Extremities: no clubbing, cyanosis, or edema Neurological: Alert and oriented to person place and time. Skin: Skin is warm and dry.  Psychiatric: Normal mood and affect. Behavior is normal.  RELEVANT LABS AND IMAGING: CBC    Component Value Date/Time   WBC 4.7 04/19/2021 1351   RBC 4.51 04/19/2021 1351   HGB 8.7 (L) 04/19/2021 1351   HCT 30.7 (L) 04/19/2021 1351   PLT 290 04/19/2021 1351   MCV 68 (L) 04/19/2021 1351   MCH 19.3 (L) 04/19/2021 1351   MCHC 28.3 (L) 04/19/2021 1351   RDW 20.5 (H) 04/19/2021 1351   LYMPHSABS 1.5 04/19/2021 1351   EOSABS 0.1 04/19/2021 1351   BASOSABS 0.1 04/19/2021 1351   Iron/TIBC/Ferritin/ %Sat    Component Value Date/Time   IRON 12 (L) 04/19/2021 1351   FERRITIN 4 (L) 04/19/2021 1351    ASSESSMENT/PLAN: 40 year old female with little past medical history who is seen in consultation at the request of Amanda Arrow, MD to evaluate iron deficiency anemia.    1. IDA  --she clearly has a significant iron deficiency anemia with very low ferritin and a hemoglobin of 8.7.  We do not have a recent additional hemoglobin value for comparison.  We discussed iron deficiency and the importance of ruling out GI blood loss.  It does not seem that this is menstrual related as her periods have not changed or been heavy.  She did bring up the possibility of hemolysis due to her intense running regimen and the possibility of red cell destruction with heel striking associated  with running.  While I have personally never seen the latter, this would potentially be a source for the low iron.  I have recommended the following: -- Upper endoscopy and colonoscopy to evaluate GI source of blood loss -- She is having a very difficult time tolerating oral iron due to GI upset and constipation; discontinue oral iron and I will place an order for IV iron at the [facility] -- Repeat CBC and ferritin today after 6 weeks of oral iron -- CMP and haptoglobin -- Celiac panel     NG:EXBMW, Eve, Md [address] [place],  [place] [address]

## 2021-05-08 NOTE — Progress Notes (Signed)
Feraheme denied by insurance thus changing to Venofer

## 2021-05-08 NOTE — Telephone Encounter (Signed)
Ok for [employer], I will add these orders Thanks JMP

## 2021-05-09 LAB — HAPTOGLOBIN: Haptoglobin: 101 mg/dL (ref 43–212)

## 2021-05-09 LAB — TISSUE TRANSGLUTAMINASE, IGA: (tTG) Ab, IgA: <1 U/mL

## 2021-05-09 LAB — IGA: Immunoglobulin A: 106 mg/dL (ref 47–310)

## 2021-05-10 ENCOUNTER — Other Ambulatory Visit: Payer: Self-pay

## 2021-05-10 ENCOUNTER — Non-Acute Institutional Stay (HOSPITAL_COMMUNITY)
Admission: RE | Admit: 2021-05-10 | Discharge: 2021-05-10 | Disposition: A | Discharge status: Home / Self Care | Payer: Commercial Managed Care - PPO | Source: Ambulatory Visit | Attending: Internal Medicine | Admitting: Internal Medicine

## 2021-05-10 ENCOUNTER — Encounter (HOSPITAL_COMMUNITY): Payer: Commercial Managed Care - PPO

## 2021-05-10 DIAGNOSIS — D509 Iron deficiency anemia, unspecified: Secondary | ICD-10-CM | POA: Insufficient documentation

## 2021-05-10 MED ORDER — SODIUM CHLORIDE 0.9 % IV SOLN
500.0000 mg | INTRAVENOUS | Status: DC
  Administered 2021-05-10: 500 mg via INTRAVENOUS
  Filled 2021-05-10: qty 25

## 2021-05-10 MED ORDER — METHYLPREDNISOLONE SODIUM SUCC 125 MG IJ SOLR: 125.0000 mg | Freq: Once | INTRAMUSCULAR | Status: DC | PRN

## 2021-05-10 MED ORDER — FAMOTIDINE IN NACL 20-0.9 MG/50ML-% IV SOLN: 20.0000 mg | Freq: Once | INTRAVENOUS | Status: DC | PRN

## 2021-05-10 MED ORDER — SODIUM CHLORIDE 0.9 % IV SOLN
INTRAVENOUS | Status: DC | PRN
  Administered 2021-05-10: 250 mL via INTRAVENOUS

## 2021-05-10 MED ORDER — ACETAMINOPHEN 325 MG PO TABS
650.0000 mg | ORAL_TABLET | Freq: Once | ORAL | Status: DC
  Filled 2021-05-10: qty 2

## 2021-05-10 MED ORDER — SODIUM CHLORIDE 0.9 % IV BOLUS: 1000.0000 mL | Freq: Once | INTRAVENOUS | Status: DC | PRN

## 2021-05-10 MED ORDER — DIPHENHYDRAMINE HCL 25 MG PO CAPS
50.0000 mg | ORAL_CAPSULE | Freq: Once | ORAL | Status: AC
  Administered 2021-05-10: 25 mg via ORAL
  Filled 2021-05-10: qty 2

## 2021-05-10 MED ORDER — DIPHENHYDRAMINE HCL 50 MG/ML IJ SOLN: 50.0000 mg | Freq: Once | INTRAMUSCULAR | Status: DC | PRN

## 2021-05-10 NOTE — Discharge Instructions (Signed)

## 2021-05-10 NOTE — Progress Notes (Signed)
PATIENT CARE CENTER NOTE  Diagnosis: Iron deficiency anemia (D50.9)   Provider: Erick Blinks, MD   Procedure: Venofer infusion    Note: Patient received Venofer infusion (1 out of 2) via PIV. Pre-medications order but patient declined Tylenol and only wanted to take 25 mg Benadryl. Patient tolerated infusion well with no adverse reaction. Vital signs stable. AVS offered but patient refused. Patient to come back in 2 weeks for second infusion.  Alert, oriented and ambulatory at discharge.

## 2021-05-15 ENCOUNTER — Encounter (HOSPITAL_COMMUNITY): Payer: Commercial Managed Care - PPO

## 2021-05-24 ENCOUNTER — Non-Acute Institutional Stay (HOSPITAL_COMMUNITY)
Admission: RE | Admit: 2021-05-24 | Discharge: 2021-05-24 | Disposition: A | Discharge status: Home / Self Care | Payer: Commercial Managed Care - PPO | Source: Ambulatory Visit | Attending: Internal Medicine | Admitting: Internal Medicine

## 2021-05-24 ENCOUNTER — Other Ambulatory Visit: Payer: Self-pay

## 2021-05-24 DIAGNOSIS — D509 Iron deficiency anemia, unspecified: Secondary | ICD-10-CM | POA: Insufficient documentation

## 2021-05-24 MED ORDER — DIPHENHYDRAMINE HCL 50 MG/ML IJ SOLN: 50.0000 mg | Freq: Once | INTRAMUSCULAR | Status: DC | PRN

## 2021-05-24 MED ORDER — SODIUM CHLORIDE 0.9 % IV SOLN
INTRAVENOUS | Status: DC | PRN
  Administered 2021-05-24: 250 mL via INTRAVENOUS

## 2021-05-24 MED ORDER — SODIUM CHLORIDE 0.9 % IV SOLN
500.0000 mg | Freq: Once | INTRAVENOUS | Status: AC
  Administered 2021-05-24: 500 mg via INTRAVENOUS
  Filled 2021-05-24: qty 25

## 2021-05-24 MED ORDER — METHYLPREDNISOLONE SODIUM SUCC 125 MG IJ SOLR: 125.0000 mg | Freq: Once | INTRAMUSCULAR | Status: DC | PRN

## 2021-05-24 MED ORDER — DIPHENHYDRAMINE HCL 25 MG PO CAPS
50.0000 mg | ORAL_CAPSULE | Freq: Once | ORAL | Status: DC
  Filled 2021-05-24: qty 2

## 2021-05-24 MED ORDER — FAMOTIDINE IN NACL 20-0.9 MG/50ML-% IV SOLN: 20.0000 mg | Freq: Once | INTRAVENOUS | Status: DC | PRN

## 2021-05-24 MED ORDER — SODIUM CHLORIDE 0.9 % IV BOLUS: 1000.0000 mL | Freq: Once | INTRAVENOUS | Status: DC | PRN

## 2021-05-24 MED ORDER — ACETAMINOPHEN 325 MG PO TABS
650.0000 mg | ORAL_TABLET | Freq: Once | ORAL | Status: DC
  Filled 2021-05-24: qty 2

## 2021-05-24 NOTE — Progress Notes (Signed)
PATIENT CARE CENTER NOTE  Diagnosis: Iron deficiency anemia (D50.9)   Provider: Erick Blinks, MD   Procedure: Venofer infusion    Note: Patient received Venofer infusion (2 out of 2) via PIV. Pre-medications ordered but patient declined. Patient tolerated infusion well with no adverse reaction. Vital signs stable. AVS offered but patient refused.  Patient alert, oriented and ambulatory at discharge.

## 2021-06-13 ENCOUNTER — Encounter: Payer: Self-pay | Admitting: Internal Medicine

## 2021-06-26 NOTE — Telephone Encounter (Signed)
She should keep scheduled ECL  Ok to order labs: CBC, ferritin + IBC, b12, zinc, vit d3, b6, and bmp  Thanks JMP

## 2021-06-27 ENCOUNTER — Other Ambulatory Visit: Payer: Self-pay

## 2021-06-27 DIAGNOSIS — D509 Iron deficiency anemia, unspecified: Secondary | ICD-10-CM

## 2021-06-29 ENCOUNTER — Other Ambulatory Visit (INDEPENDENT_AMBULATORY_CARE_PROVIDER_SITE_OTHER): Payer: Commercial Managed Care - PPO

## 2021-06-29 ENCOUNTER — Encounter: Payer: Self-pay | Admitting: Physician Assistant

## 2021-06-29 ENCOUNTER — Telehealth: Payer: Commercial Managed Care - PPO | Admitting: Physician Assistant

## 2021-06-29 DIAGNOSIS — T63481A Toxic effect of venom of other arthropod, accidental (unintentional), initial encounter: Secondary | ICD-10-CM | POA: Diagnosis not present

## 2021-06-29 DIAGNOSIS — D509 Iron deficiency anemia, unspecified: Secondary | ICD-10-CM | POA: Diagnosis not present

## 2021-06-29 LAB — CBC WITH DIFFERENTIAL/PLATELET
Basophils Absolute: 0.1 10*3/uL (ref 0.0–0.1)
Basophils Relative: 0.9 % (ref 0.0–3.0)
Eosinophils Absolute: 0.1 10*3/uL (ref 0.0–0.7)
Eosinophils Relative: 1.5 % (ref 0.0–5.0)
HCT: 38.3 % (ref 36.0–46.0)
Hemoglobin: 12.6 g/dL (ref 12.0–15.0)
Lymphocytes Relative: 23.2 % (ref 12.0–46.0)
Lymphs Abs: 1.3 10*3/uL (ref 0.7–4.0)
MCHC: 32.9 g/dL (ref 30.0–36.0)
MCV: 82 fl (ref 78.0–100.0)
Monocytes Absolute: 0.3 10*3/uL (ref 0.1–1.0)
Monocytes Relative: 5.5 % (ref 3.0–12.0)
Neutro Abs: 3.9 10*3/uL (ref 1.4–7.7)
Neutrophils Relative %: 68.9 % (ref 43.0–77.0)
Platelets: 210 10*3/uL (ref 150.0–400.0)
RBC: 4.67 Mil/uL (ref 3.87–5.11)
RDW: 31 % — ABNORMAL HIGH (ref 11.5–15.5)
WBC: 5.7 10*3/uL (ref 4.0–10.5)

## 2021-06-29 LAB — BASIC METABOLIC PANEL
BUN: 16 mg/dL (ref 6–23)
CO2: 31 mEq/L (ref 19–32)
Calcium: 10 mg/dL (ref 8.4–10.5)
Chloride: 101 mEq/L (ref 96–112)
Creatinine, Ser: 0.72 mg/dL (ref 0.40–1.20)
GFR: 105.07 mL/min (ref >60.00)
Glucose, Bld: 89 mg/dL (ref 70–99)
Potassium: 4.1 mEq/L (ref 3.5–5.1)
Sodium: 139 mEq/L (ref 135–145)

## 2021-06-29 LAB — IBC PANEL
Iron: 89 ug/dL (ref 42–145)
Saturation Ratios: 21.3 % (ref 20.0–50.0)
Transferrin: 299 mg/dL (ref 212.0–360.0)

## 2021-06-29 LAB — FERRITIN: Ferritin: 21.1 ng/mL (ref 10.0–291.0)

## 2021-06-29 LAB — VITAMIN B12: Vitamin B-12: 615 pg/mL (ref 211–911)

## 2021-06-29 MED ORDER — DOXYCYCLINE HYCLATE 100 MG PO TABS
100.0000 mg | ORAL_TABLET | Freq: Two times a day (BID) | ORAL | 0 refills | Status: DC
Start: 2021-06-29 — End: 2021-07-24

## 2021-06-29 MED ORDER — PREDNISONE 20 MG PO TABS: 20.0000 mg | ORAL_TABLET | Freq: Every day | ORAL | 0 refills | Status: DC

## 2021-06-29 NOTE — Patient Instructions (Signed)
  Amanda [name], thank you for joining Margaretann Loveless, PA-C for today's virtual visit.  While this provider is not your primary care provider (PCP), if your PCP is located in our provider database this encounter information will be shared with them immediately following your visit.  Consent: (Patient) Amanda [name] provided verbal consent for this virtual visit at the beginning of the encounter.  Current Medications:  Current Outpatient Medications:    doxycycline (VIBRA-TABS) 100 MG tablet, Take 1 tablet (100 mg total) by mouth 2 (two) times daily., Disp: 10 tablet, Rfl: 0   predniSONE (DELTASONE) 20 MG tablet, Take 1 tablet (20 mg total) by mouth daily with breakfast., Disp: 5 tablet, Rfl: 0   Sodium Sulfate-Mag Sulfate-KCl (SUTAB) [phone] MG TABS, Use as directed for colonoscopy. MANUFACTURER CODES!! BIN: [id number] PCN: CN GROUP: [id number] MEMBER ID: [id number];[employer] AS SECONDARY INSURANCE ;NO PRIOR AUTHORIZATION, Disp: 24 tablet, Rfl: 0   Medications ordered in this encounter:  Meds ordered this encounter  Medications   predniSONE (DELTASONE) 20 MG tablet    Sig: Take 1 tablet (20 mg total) by mouth daily with breakfast.    Dispense:  5 tablet    Refill:  0    Order Specific Question:   Supervising Provider    Answer:   Hyacinth Meeker, BRIAN [[id number]]   doxycycline (VIBRA-TABS) 100 MG tablet    Sig: Take 1 tablet (100 mg total) by mouth 2 (two) times daily.    Dispense:  10 tablet    Refill:  0    Order Specific Question:   Supervising Provider    Answer:   Hyacinth Meeker, BRIAN [[id number]]     *If you need refills on other medications prior to your next appointment, please contact your pharmacy*  Follow-Up: Call back or seek an in-person evaluation if the symptoms worsen or if the condition fails to improve as anticipated.  If you have been instructed to have an in-person evaluation today at a local Urgent Care facility, please use the link below. It will take you to a list of all of our  available [facility], including address, phone number and hours of operation. Please do not delay care.  [facility]  If you or a family member do not have a primary care provider, use the link below to schedule a visit and establish care. When you choose a [facility] primary care physician or advanced practice provider, you gain a long-term partner in health. Find a Primary Care Provider  Learn more about [facility]'s in-office and virtual care options: [facility] - Get Care Now

## 2021-06-29 NOTE — Progress Notes (Signed)
[name], patalano are scheduled for a virtual visit with your provider today.    Just as we do with appointments in the office, we must obtain your consent to participate.  Your consent will be active for this visit and any virtual visit you may have with one of our providers in the next 365 days.    If you have a MyChart account, I can also send a copy of this consent to you electronically.  All virtual visits are billed to your insurance company just like a traditional visit in the office.  As this is a virtual visit, video technology does not allow for your provider to perform a traditional examination.  This may limit your provider's ability to fully assess your condition.  If your provider identifies any concerns that need to be evaluated in person or the need to arrange testing such as labs, EKG, etc, we will make arrangements to do so.    Although advances in technology are sophisticated, we cannot ensure that it will always work on either your end or our end.  If the connection with a video visit is poor, we may have to switch to a telephone visit.  With either a video or telephone visit, we are not always able to ensure that we have a secure connection.   I need to obtain your verbal consent now.   Are you willing to proceed with your visit today?   [name] has provided verbal consent on 06/29/2021 for a virtual visit (video or telephone).   Margaretann Loveless, PA-C 06/29/2021  10:14 AM  Virtual Visit Consent   Haydee Salter, you are scheduled for a virtual visit with a  provider today.     Just as with appointments in the office, your consent must be obtained to participate.  Your consent will be active for this visit and any virtual visit you may have with one of our providers in the next 365 days.     If you have a MyChart account, a copy of this consent can be sent to you electronically.  All virtual visits are billed to your insurance company just like a traditional  visit in the office.    As this is a virtual visit, video technology does not allow for your provider to perform a traditional examination.  This may limit your provider's ability to fully assess your condition.  If your provider identifies any concerns that need to be evaluated in person or the need to arrange testing (such as labs, EKG, etc.), we will make arrangements to do so.     Although advances in technology are sophisticated, we cannot ensure that it will always work on either your end or our end.  If the connection with a video visit is poor, the visit may have to be switched to a telephone visit.  With either a video or telephone visit, we are not always able to ensure that we have a secure connection.     I need to obtain your verbal consent now.   Are you willing to proceed with your visit today?    [name] has provided verbal consent on 06/29/2021 for a virtual visit (video or telephone).   Margaretann Loveless, PA-C   Date: 06/29/2021 10:14 AM   Virtual Visit via Video Note   I, Margaretann Loveless, connected with  Amanda [name]  ([id number], 1981/11/30) on 06/29/21 at 10:00 AM EDT by a video-enabled telemedicine application and  verified that I am speaking with the correct person using two identifiers.  Location: Patient: Virtual Visit Location Patient: Home Provider: Virtual Visit Location Provider: Home Office   I discussed the limitations of evaluation and management by telemedicine and the availability of in person appointments. The patient expressed understanding and agreed to proceed.    History of Present Illness: Amanda [name] is a 40 y.o. who identifies as a female who was assigned female at birth, and is being seen today for insect sting. She has a tendency to develop cellulitis quickly with bee stings. She thinks she may have been stung by a hornet. It is very painful. Rash is spreading quickly. Now down medial thigh and into medial-posterior  calf.   Problems:  Patient Active Problem List   Diagnosis Date Noted   Iron deficiency anemia 05/08/2021   Femoral neck stress fracture, initial encounter 03/17/2019   Disturbance of skin sensation 11/22/2013   UPPER RESPIRATORY INFECTION, VIRAL 05/26/2008   HYPERLIPIDEMIA 12/04/2006   ANEMIA-NOS 12/04/2006   MIGRAINE HEADACHE 12/04/2006   ALLERGIC RHINITIS 12/04/2006   ASTHMA 12/04/2006   PLACENTA ABRUPTIO 12/04/2006    Allergies:  Allergies  Allergen Reactions   Cephalexin Swelling   Codeine    Darvocet A500 [Propoxyphene N-Acetaminophen]    Dilaudid [Hydromorphone Hcl]    Latex    Macrobid [Nitrofurantoin Macrocrystal]    Morphine Sulfate    Sulfa Antibiotics    Medications:  Current Outpatient Medications:    doxycycline (VIBRA-TABS) 100 MG tablet, Take 1 tablet (100 mg total) by mouth 2 (two) times daily., Disp: 10 tablet, Rfl: 0   predniSONE (DELTASONE) 20 MG tablet, Take 1 tablet (20 mg total) by mouth daily with breakfast., Disp: 5 tablet, Rfl: 0   Sodium Sulfate-Mag Sulfate-KCl (SUTAB) [phone] MG TABS, Use as directed for colonoscopy. MANUFACTURER CODES!! BIN: [id number] PCN: CN GROUP: [id number] MEMBER ID: [id number];[employer] AS SECONDARY INSURANCE ;NO PRIOR AUTHORIZATION, Disp: 24 tablet, Rfl: 0  Observations/Objective: Patient is well-developed, well-nourished in no acute distress.  Resting comfortably at home.  Head is normocephalic, atraumatic.  No labored breathing. Speech is clear and coherent with logical content.  Patient is alert and oriented at baseline.  Erythematous rash with being warm to the touch and painful per patient noted on medial thigh at knee radiating down into posterior calf.  Assessment and Plan: 1. Insect stings, accidental or unintentional, initial encounter - predniSONE (DELTASONE) 20 MG tablet; Take 1 tablet (20 mg total) by mouth daily with breakfast.  Dispense: 5 tablet; Refill: 0 - doxycycline (VIBRA-TABS) 100 MG tablet; Take 1  tablet (100 mg total) by mouth 2 (two) times daily.  Dispense: 10 tablet; Refill: 0 - Worsening area despite OTC treatment - Prednisone provided for allergic reaction - Doxycyline for cellulitis - Cool compresses - May still use Benadryl prn - Seek in person evaluation if worsening or not improving with treatment  Follow Up Instructions: I discussed the assessment and treatment plan with the patient. The patient was provided an opportunity to ask questions and all were answered. The patient agreed with the plan and demonstrated an understanding of the instructions.  A copy of instructions were sent to the patient via MyChart.  The patient was advised to call back or seek an in-person evaluation if the symptoms worsen or if the condition fails to improve as anticipated.  Time:  I spent 11 minutes with the patient via telehealth technology discussing the above problems/concerns.    Margaretann Loveless, PA-C

## 2021-07-03 ENCOUNTER — Other Ambulatory Visit: Payer: Self-pay

## 2021-07-03 ENCOUNTER — Ambulatory Visit
Admission: RE | Admit: 2021-07-03 | Discharge: 2021-07-03 | Disposition: A | Discharge status: Home / Self Care | Payer: Commercial Managed Care - PPO | Source: Ambulatory Visit | Attending: Chiropractic Medicine | Admitting: Chiropractic Medicine

## 2021-07-03 ENCOUNTER — Other Ambulatory Visit: Payer: Self-pay | Admitting: Chiropractic Medicine

## 2021-07-03 DIAGNOSIS — R52 Pain, unspecified: Secondary | ICD-10-CM

## 2021-07-03 DIAGNOSIS — T148XXA Other injury of unspecified body region, initial encounter: Secondary | ICD-10-CM

## 2021-07-03 LAB — VITAMIN D 1,25 DIHYDROXY
Vitamin D 1, 25 (OH)2 Total: 39 pg/mL (ref 18–72)
Vitamin D2 1, 25 (OH)2: <8 pg/mL
Vitamin D3 1, 25 (OH)2: 39 pg/mL

## 2021-07-10 ENCOUNTER — Other Ambulatory Visit: Payer: Self-pay

## 2021-07-10 NOTE — Telephone Encounter (Signed)
Please let pt know I got her email Okay to proceed with abd doppler, rule of artertial aneurysm Dx: palpable, pulsatile abd mass

## 2021-07-11 ENCOUNTER — Other Ambulatory Visit: Payer: Self-pay

## 2021-07-12 ENCOUNTER — Other Ambulatory Visit: Payer: Self-pay

## 2021-07-12 DIAGNOSIS — R1900 Intra-abdominal and pelvic swelling, mass and lump, unspecified site: Secondary | ICD-10-CM

## 2021-07-16 ENCOUNTER — Other Ambulatory Visit (HOSPITAL_COMMUNITY): Payer: Self-pay | Admitting: Internal Medicine

## 2021-07-16 DIAGNOSIS — R1900 Intra-abdominal and pelvic swelling, mass and lump, unspecified site: Secondary | ICD-10-CM

## 2021-07-17 ENCOUNTER — Other Ambulatory Visit: Payer: Self-pay

## 2021-07-17 ENCOUNTER — Ambulatory Visit (HOSPITAL_COMMUNITY)
Admission: RE | Admit: 2021-07-17 | Discharge: 2021-07-17 | Disposition: A | Discharge status: Home / Self Care | Payer: Commercial Managed Care - PPO | Source: Ambulatory Visit | Attending: Internal Medicine | Admitting: Internal Medicine

## 2021-07-17 DIAGNOSIS — R1900 Intra-abdominal and pelvic swelling, mass and lump, unspecified site: Secondary | ICD-10-CM | POA: Diagnosis present

## 2021-07-23 ENCOUNTER — Encounter: Payer: Self-pay | Admitting: Internal Medicine

## 2021-07-24 ENCOUNTER — Other Ambulatory Visit: Payer: Self-pay

## 2021-07-24 ENCOUNTER — Encounter: Payer: Self-pay | Admitting: Internal Medicine

## 2021-07-24 ENCOUNTER — Ambulatory Visit (AMBULATORY_SURGERY_CENTER): Payer: Commercial Managed Care - PPO | Admitting: Internal Medicine

## 2021-07-24 ENCOUNTER — Other Ambulatory Visit (INDEPENDENT_AMBULATORY_CARE_PROVIDER_SITE_OTHER): Payer: Commercial Managed Care - PPO

## 2021-07-24 VITALS — BP 100/60 | HR 56 | Temp 98.1°F | Resp 17 | Ht 65.5 in | Wt 107.0 lb

## 2021-07-24 DIAGNOSIS — K2100 Gastro-esophageal reflux disease with esophagitis, without bleeding: Secondary | ICD-10-CM

## 2021-07-24 DIAGNOSIS — K2970 Gastritis, unspecified, without bleeding: Secondary | ICD-10-CM | POA: Diagnosis not present

## 2021-07-24 DIAGNOSIS — D509 Iron deficiency anemia, unspecified: Secondary | ICD-10-CM | POA: Diagnosis not present

## 2021-07-24 DIAGNOSIS — K648 Other hemorrhoids: Secondary | ICD-10-CM | POA: Diagnosis not present

## 2021-07-24 DIAGNOSIS — M84376S Stress fracture, unspecified foot, sequela: Secondary | ICD-10-CM

## 2021-07-24 DIAGNOSIS — K2289 Other specified disease of esophagus: Secondary | ICD-10-CM | POA: Diagnosis not present

## 2021-07-24 LAB — CALCIUM: Calcium: 8.6 mg/dL (ref 8.4–10.5)

## 2021-07-24 LAB — IBC + FERRITIN
Ferritin: 19.9 ng/mL (ref 10.0–291.0)
Iron: 63 ug/dL (ref 42–145)
Saturation Ratios: 16 % — ABNORMAL LOW (ref 20.0–50.0)
Transferrin: 282 mg/dL (ref 212.0–360.0)

## 2021-07-24 LAB — VITAMIN D 25 HYDROXY (VIT D DEFICIENCY, FRACTURES): VITD: 44.78 ng/mL (ref 30.00–100.00)

## 2021-07-24 MED ORDER — SODIUM CHLORIDE 0.9 % IV SOLN: 500.0000 mL | Freq: Once | INTRAVENOUS | Status: DC

## 2021-07-24 NOTE — Progress Notes (Signed)
Called to room to assist during endoscopic procedure.  Patient ID and intended procedure confirmed with present staff. Received instructions for my participation in the procedure from the performing physician.  

## 2021-07-24 NOTE — Op Note (Signed)
[facility] Patient Name: Amanda [name] Procedure Date: 07/24/2021 2:20 PM MRN: [id number] Endoscopist: Beverley Fiedler , MD Age: 40 Referring MD:  Date of Birth: [birth year] Gender: Female Account #: [id number] Procedure:                Upper GI endoscopy Indications:              Iron deficiency anemia Medicines:                Monitored Anesthesia Care Procedure:                Pre-Anesthesia Assessment:                           - Prior to the procedure, a History and Physical                            was performed, and patient medications and                            allergies were reviewed. The patient's tolerance of                            previous anesthesia was also reviewed. The risks                            and benefits of the procedure and the sedation                            options and risks were discussed with the patient.                            All questions were answered, and informed consent                            was obtained. Prior Anticoagulants: The patient has                            taken no previous anticoagulant or antiplatelet                            agents. ASA Grade Assessment: II - A patient with                            mild systemic disease. After reviewing the risks                            and benefits, the patient was deemed in                            satisfactory condition to undergo the procedure.                           After obtaining informed consent, the endoscope was  passed under direct vision. Throughout the                            procedure, the patient's blood pressure, pulse, and                            oxygen saturations were monitored continuously. The                            Endoscope was introduced through the mouth, and                            advanced to the second part of duodenum. The upper                            GI endoscopy was accomplished  without difficulty.                            The patient tolerated the procedure well. Scope In: Scope Out: Findings:                 Diffuse mild mucosal changes characterized by                            congestion, discoloration and longitudinal markings                            were found in the entire esophagus. Biopsies were                            obtained from the proximal and distal esophagus                            with cold forceps for histology of suspected                            eosinophilic esophagitis.                           Moderate inflammation characterized by adherent                            blood, erythema and granularity was found in the                            gastric body. Biopsies were taken with a cold                            forceps for histology and Helicobacter pylori                            testing.                           The examined duodenum was normal. Biopsies for  histology were taken with a cold forceps for                            evaluation of celiac disease. Complications:            No immediate complications. Estimated Blood Loss:     Estimated blood loss was minimal. Impression:               - Congested, discolored, longitudinally marked                            mucosa in the esophagus. Biopsied.                           - Gastritis with adherent heme. Biopsied.                           - Normal examined duodenum. Biopsied. Recommendation:           - Patient has a contact number available for                            emergencies. The signs and symptoms of potential                            delayed complications were discussed with the                            patient. Return to normal activities tomorrow.                            Written discharge instructions were provided to the                            patient.                           - Resume previous diet.                            - Continue present medications.                           - Await pathology results.                           - Repeat ferritin + IBC today. Anticipate repeating                            IV iron.                           - After pathology results reviewed consider VCE.                           - See the other procedure note for documentation of  additional recommendations. Beverley Fiedler, MD 07/24/2021 3:11:20 PM This report has been signed electronically.

## 2021-07-24 NOTE — Patient Instructions (Signed)
Resume previous diet Continue current medications Await pathology results Ferritin and IBC today YOU HAD AN ENDOSCOPIC PROCEDURE TODAY AT THE [facility]:   Refer to the procedure report that was given to you for any specific questions about what was found during the examination.  If the procedure report does not answer your questions, please call your gastroenterologist to clarify.  If you requested that your care partner not be given the details of your procedure findings, then the procedure report has been included in a sealed envelope for you to review at your convenience later.  YOU SHOULD EXPECT: Some feelings of bloating in the abdomen. Passage of more gas than usual.  Walking can help get rid of the air that was put into your GI tract during the procedure and reduce the bloating. If you had a lower endoscopy (such as a colonoscopy or flexible sigmoidoscopy) you may notice spotting of blood in your stool or on the toilet paper. If you underwent a bowel prep for your procedure, you may not have a normal bowel movement for a few days.  Please Note:  You might notice some irritation and congestion in your nose or some drainage.  This is from the oxygen used during your procedure.  There is no need for concern and it should clear up in a day or so.  SYMPTOMS TO REPORT IMMEDIATELY:  Following lower endoscopy (colonoscopy or flexible sigmoidoscopy):  Excessive amounts of blood in the stool  Significant tenderness or worsening of abdominal pains  Swelling of the abdomen that is new, acute  Fever of 100F or higher  Following upper endoscopy (EGD)  Vomiting of blood or coffee ground material  New chest pain or pain under the shoulder blades  Painful or persistently difficult swallowing  New shortness of breath  Fever of 100F or higher  Black, tarry-looking stools  For urgent or emergent issues, a gastroenterologist can be reached at any hour by calling [phone]. Do not  use MyChart messaging for urgent concerns.   DIET:  We do recommend a small meal at first, but then you may proceed to your regular diet.  Drink plenty of fluids but you should avoid alcoholic beverages for 24 hours.  ACTIVITY:  You should plan to take it easy for the rest of today and you should NOT DRIVE or use heavy machinery until tomorrow (because of the sedation medicines used during the test).    FOLLOW UP: Our staff will call the number listed on your records 48-72 hours following your procedure to check on you and address any questions or concerns that you may have regarding the information given to you following your procedure. If we do not reach you, we will leave a message.  We will attempt to reach you two times.  During this call, we will ask if you have developed any symptoms of COVID 19. If you develop any symptoms (ie: fever, flu-like symptoms, shortness of breath, cough etc.) before then, please call [phone].  If you test positive for Covid 19 in the 2 weeks post procedure, please call and report this information to [place].    If any biopsies were taken you will be contacted by phone or by letter within the next 1-3 weeks.  Please call us at [phone] if you have not heard about the biopsies in 3 weeks.   SIGNATURES/CONFIDENTIALITY: You and/or your care partner have signed paperwork which will be entered into your electronic medical record.  These signatures attest  to the fact that that the information above on your After Visit Summary has been reviewed and is understood.  Full responsibility of the confidentiality of this discharge information lies with you and/or your care-partner.

## 2021-07-24 NOTE — Progress Notes (Signed)
Check-in-JB  V/S-[place] 

## 2021-07-24 NOTE — Progress Notes (Signed)
Report to PACU, RN, vss, BBS= Clear.  

## 2021-07-24 NOTE — Op Note (Signed)
[facility] Patient Name: Amanda [name] Procedure Date: 07/24/2021 2:17 PM MRN: [id number] Endoscopist: Beverley Fiedler , MD Age: 40 Referring MD:  Date of Birth: [birth year] Gender: Female Account #: [id number] Procedure:                Colonoscopy Indications:              Iron deficiency anemia Medicines:                Monitored Anesthesia Care Procedure:                Pre-Anesthesia Assessment:                           - Prior to the procedure, a History and Physical                            was performed, and patient medications and                            allergies were reviewed. The patient's tolerance of                            previous anesthesia was also reviewed. The risks                            and benefits of the procedure and the sedation                            options and risks were discussed with the patient.                            All questions were answered, and informed consent                            was obtained. Prior Anticoagulants: The patient has                            taken no previous anticoagulant or antiplatelet                            agents. ASA Grade Assessment: II - A patient with                            mild systemic disease. After reviewing the risks                            and benefits, the patient was deemed in                            satisfactory condition to undergo the procedure.                           After obtaining informed consent, the colonoscope  was passed under direct vision. Throughout the                            procedure, the patient's blood pressure, pulse, and                            oxygen saturations were monitored continuously. The                            0405 PCF-H190TL Slim SB Colonoscope was introduced                            through the anus and advanced to the terminal                            ileum. The colonoscopy was performed  without                            difficulty. The patient tolerated the procedure                            well. The quality of the bowel preparation was                            good. The terminal ileum, ileocecal valve,                            appendiceal orifice, and rectum were photographed. Scope In: 2:46:06 PM Scope Out: 3:03:15 PM Scope Withdrawal Time: 0 hours 14 minutes 18 seconds  Total Procedure Duration: 0 hours 17 minutes 9 seconds  Findings:                 The digital rectal exam was normal.                           The terminal ileum appeared normal.                           The entire examined colon appeared normal.                           Internal hemorrhoids were found during                            retroflexion. The hemorrhoids were small. Complications:            No immediate complications. Estimated Blood Loss:     Estimated blood loss: none. Impression:               - The examined portion of the ileum was normal.                           - The entire examined colon is normal.                           - Small internal  hemorrhoids.                           - No specimens collected. Recommendation:           - Patient has a contact number available for                            emergencies. The signs and symptoms of potential                            delayed complications were discussed with the                            patient. Return to normal activities tomorrow.                            Written discharge instructions were provided to the                            patient.                           - Resume previous diet.                           - Continue present medications.                           - Repeat colonoscopy in 10 years for screening                            purposes.                           - See EGD report from today. Beverley Fiedler, MD 07/24/2021 3:13:11 PM This report has been signed electronically.

## 2021-07-26 ENCOUNTER — Telehealth: Payer: Self-pay | Admitting: *Deleted

## 2021-07-26 NOTE — Telephone Encounter (Signed)
  Follow up Call-  Call back number 07/24/2021  Post procedure Call Back phone  # [phone]  Permission to leave phone message Yes  Some recent data might be hidden     Patient questions:  Do you have a fever, pain , or abdominal swelling? No. Pain Score  0 *  Have you tolerated food without any problems? Yes.    Have you been able to return to your normal activities? Yes.    Do you have any questions about your discharge instructions: Diet   No. Medications  No. Follow up visit  No.  Do you have questions or concerns about your Care? No.  Actions: * If pain score is 4 or above: No action needed, pain <4.  Have you developed a fever since your procedure? no  2.   Have you had an respiratory symptoms (SOB or cough) since your procedure? no  3.   Have you tested positive for COVID 19 since your procedure no  4.   Have you had any family members/close contacts diagnosed with the COVID 19 since your procedure?  no   If yes to any of these questions please route to Laverna Peace, RN and Karlton Lemon, RN

## 2021-08-05 ENCOUNTER — Encounter: Payer: Self-pay | Admitting: Internal Medicine

## 2021-08-08 NOTE — Telephone Encounter (Signed)
Please let patient know I got her last email  With the history of symptoms which are consistent with GERD and match the endoscopic findings I would recommend pantoprazole 40 mg daily for 8 to 12 weeks. I would like to see her back in clinic in about 12 weeks She should let me know how she is responding to this medication particularly from these GERD symptoms  Please refer her to hematology for evaluation of iron deficiency anemia  Thanks JMP

## 2021-08-09 ENCOUNTER — Other Ambulatory Visit: Payer: Self-pay

## 2021-08-09 DIAGNOSIS — D509 Iron deficiency anemia, unspecified: Secondary | ICD-10-CM

## 2021-08-09 DIAGNOSIS — K219 Gastro-esophageal reflux disease without esophagitis: Secondary | ICD-10-CM

## 2021-08-09 MED ORDER — PANTOPRAZOLE SODIUM 40 MG PO TBEC: 40.0000 mg | DELAYED_RELEASE_TABLET | Freq: Every day | ORAL | 1 refills | Status: DC

## 2021-08-10 ENCOUNTER — Other Ambulatory Visit: Payer: Self-pay

## 2021-08-10 ENCOUNTER — Telehealth: Payer: Self-pay | Admitting: Hematology and Oncology

## 2021-08-10 DIAGNOSIS — D509 Iron deficiency anemia, unspecified: Secondary | ICD-10-CM

## 2021-08-10 NOTE — Telephone Encounter (Signed)
Received a new hem referral from Dr. Rhea Belton for IDA. Ms. Hennings has been cld and scheduled to see Dr. Leonides Schanz on 8/22 at 920am. Pt aware to arrive 20 minutes early.

## 2021-08-16 ENCOUNTER — Ambulatory Visit (INDEPENDENT_AMBULATORY_CARE_PROVIDER_SITE_OTHER): Payer: Commercial Managed Care - PPO | Admitting: Internal Medicine

## 2021-08-16 ENCOUNTER — Encounter: Payer: Self-pay | Admitting: Internal Medicine

## 2021-08-16 DIAGNOSIS — D509 Iron deficiency anemia, unspecified: Secondary | ICD-10-CM | POA: Diagnosis not present

## 2021-08-16 NOTE — Progress Notes (Signed)
SN: [id number] Exp: 11/06/2022 LOT: [id number] Patient arrived for Capsule Endoscopy. Reported the prep went well. This nurse explained dietary restrictions for the next few hours. Patient verbalized understanding. Opened capsule, ensured capsule was flashing prior to the patient swallowing the capsule. Patient swallowed capsule without difficulty. Patient instructed to return to the office at 4:00 pm today for removal of the recording equipment, to call the office with any questions and if no capsule was visualized after 72 hours. No further questions by the conclusion of the visit.

## 2021-08-16 NOTE — Patient Instructions (Signed)
   Contact our office immediately at [phone] if you suffer from any abdominal pain, nausea, or vomiting during capsule endoscopy. a) Do not eat or drink for at least 2 hours. After 2 hours you may have any of the following to drink: Water   White grape juice 7-Up   Chicken Bouillon Sprite   Ginger Ale c) After 4 hours you may have a light snack to include any of the following: A cup of soup   sandwich Bowl of cereal  Rice Toast   Eggs 2-3 small cookies (i.e. vanilla wafers or graham crackers) d) After 8 hours you may return to your regular diet. During your procedure do not go near anyone else that is having capsule endoscopy. Do not be in close contact with an MRI machine or a radio or television tower. Do not wear a heavy coat or sweater because your recorder may over heat and stop recording.   Do not disconnect the equipment or remove the belt at any time.  Since the Data Recorder is actually a small computer, it should be treated with utmost care and protection.  Avoid sudden movement and banging of the Data Recorder.  Do not do any heavy lifting or strenuous physical activity during the test especially if it involves sweating and do not bend over or stoop during capsule endoscopy. During capsule endoscopy, you will need to verify every 15 minutes that the small light on top of the Data Recorder is blinking twice per second.  If for some reason it stops blinking at this site, record the time and contact our office at [phone].   Patient Name  ID No:   Time Event (eating, drinking, activity and unusual sensations)                         Who to call in case of need: Time to return to facility:     Special Instructions:

## 2021-08-20 ENCOUNTER — Inpatient Hospital Stay: Payer: Commercial Managed Care - PPO | Attending: Hematology and Oncology | Admitting: Hematology and Oncology

## 2021-08-20 ENCOUNTER — Inpatient Hospital Stay: Payer: Commercial Managed Care - PPO

## 2021-08-20 ENCOUNTER — Other Ambulatory Visit: Payer: Self-pay

## 2021-08-20 VITALS — BP 124/85 | HR 61 | Temp 97.6°F | Resp 16 | Ht 65.5 in | Wt 108.0 lb

## 2021-08-20 DIAGNOSIS — R319 Hematuria, unspecified: Secondary | ICD-10-CM | POA: Insufficient documentation

## 2021-08-20 DIAGNOSIS — Z87891 Personal history of nicotine dependence: Secondary | ICD-10-CM | POA: Diagnosis not present

## 2021-08-20 DIAGNOSIS — Z87442 Personal history of urinary calculi: Secondary | ICD-10-CM | POA: Insufficient documentation

## 2021-08-20 DIAGNOSIS — D509 Iron deficiency anemia, unspecified: Secondary | ICD-10-CM | POA: Insufficient documentation

## 2021-08-20 DIAGNOSIS — D508 Other iron deficiency anemias: Secondary | ICD-10-CM

## 2021-08-20 LAB — CMP (CANCER CENTER ONLY)
ALT: 30 U/L (ref 0–44)
AST: 27 U/L (ref 15–41)
Albumin: 4.8 g/dL (ref 3.5–5.0)
Alkaline Phosphatase: 45 U/L (ref 38–126)
Anion gap: 9 (ref 5–15)
BUN: 18 mg/dL (ref 6–20)
CO2: 28 mmol/L (ref 22–32)
Calcium: 9.9 mg/dL (ref 8.9–10.3)
Chloride: 104 mmol/L (ref 98–111)
Creatinine: 0.78 mg/dL (ref 0.44–1.00)
GFR, Estimated: >60 mL/min (ref >60)
Glucose, Bld: 94 mg/dL (ref 70–99)
Potassium: 4.7 mmol/L (ref 3.5–5.1)
Sodium: 141 mmol/L (ref 135–145)
Total Bilirubin: 0.3 mg/dL (ref 0.3–1.2)
Total Protein: 7.6 g/dL (ref 6.5–8.1)

## 2021-08-20 LAB — CBC WITH DIFFERENTIAL (CANCER CENTER ONLY)
Abs Immature Granulocytes: 0.01 10*3/uL (ref 0.00–0.07)
Basophils Absolute: 0.1 10*3/uL (ref 0.0–0.1)
Basophils Relative: 1 %
Eosinophils Absolute: 0.1 10*3/uL (ref 0.0–0.5)
Eosinophils Relative: 1 %
HCT: 42.8 % (ref 36.0–46.0)
Hemoglobin: 14.3 g/dL (ref 12.0–15.0)
Immature Granulocytes: 0 %
Lymphocytes Relative: 24 %
Lymphs Abs: 1.4 10*3/uL (ref 0.7–4.0)
MCH: 30.2 pg (ref 26.0–34.0)
MCHC: 33.4 g/dL (ref 30.0–36.0)
MCV: 90.5 fL (ref 80.0–100.0)
Monocytes Absolute: 0.4 10*3/uL (ref 0.1–1.0)
Monocytes Relative: 7 %
Neutro Abs: 4 10*3/uL (ref 1.7–7.7)
Neutrophils Relative %: 67 %
Platelet Count: 205 10*3/uL (ref 150–400)
RBC: 4.73 MIL/uL (ref 3.87–5.11)
RDW: 17.2 % — ABNORMAL HIGH (ref 11.5–15.5)
WBC Count: 6 10*3/uL (ref 4.0–10.5)
nRBC: 0 % (ref 0.0–0.2)

## 2021-08-20 LAB — RETIC PANEL
Immature Retic Fract: 4.3 % (ref 2.3–15.9)
RBC.: 4.65 MIL/uL (ref 3.87–5.11)
Retic Count, Absolute: 26.5 10*3/uL (ref 19.0–186.0)
Retic Ct Pct: 0.6 % (ref 0.4–3.1)
Reticulocyte Hemoglobin: 35.1 pg (ref >27.9)

## 2021-08-20 LAB — IRON AND TIBC
Iron: 79 ug/dL (ref 41–142)
Saturation Ratios: 19 % — ABNORMAL LOW (ref 21–57)
TIBC: 426 ug/dL (ref 236–444)
UIBC: 347 ug/dL (ref 120–384)

## 2021-08-20 LAB — SAVE SMEAR(SSMR), FOR PROVIDER SLIDE REVIEW

## 2021-08-20 LAB — FERRITIN: Ferritin: 12 ng/mL (ref 11–307)

## 2021-08-20 LAB — LACTATE DEHYDROGENASE: LDH: 113 U/L (ref 98–192)

## 2021-08-20 NOTE — Progress Notes (Signed)
[facility] [phone]   [phone]  INITIAL CONSULT NOTE  Patient Care Team: [name], MD as PCP - General (Family Medicine)  Hematological/Oncological History # Iron Deficiency Anemia 05/08/2021: WBC 4.5, Hgb 9.7, MCV 64.8, Plt 323, Iron 14, Sat 2.4% 05/24/2021: Iron Sucrose 500 mg x 2 doses 06/29/2021: WBC 5.7, Hgb 12.6, MCV 82, Plt 210. Iron 89, Sat 21.3% 07/24/2021: EGD performed, no evidence of celiac disease or clear source of bleeding. Iron Sat 16%, ferritin 19.9 08/20/2021: establish care with Dr. Leonides Schanz   CHIEF COMPLAINTS/PURPOSE OF CONSULTATION:  "Iron Deficiency Anemia "  HISTORY OF PRESENTING ILLNESS:  Amanda [name] 40 y.o. female with medical history significant for hypercalcemia, veganism, thoracic outlet syndrome, and migraines who presents for evaluation of iron deficiency anemia.   On review of the previous records Amanda [name] was noted to have a hemoglobin 9.7 with MCV of 64.8 on 05/08/2021.  She received 2 doses of IV iron sucrose 500 mg each in May 2022.  On 06/30/2019 the patient had a repeat blood draw which showed hemoglobin of 12.6 with an iron of 89, sat of 21.3.  On 07/25/2019 the patient underwent an EGD which showed no evidence of celiac disease or clear source of bleeding.  She was noted to have an iron saturation of 16% with ferritin of 19.9.  Due to concern for this patient's iron deficiency anemia of unclear etiology she was referred to hematology for further evaluation and management.  On exam today Amanda [name] reports that she is a vegan and currently eats iron rich foods in order to help bolster her iron levels.  She is also been drinking protein shakes and tries eat a balanced diet.  She notes that her menstrual cycles are normal and last approximately 5 days with her heaviest day being day 2.  She only missed about 5 pads or tampons on her heaviest day.  She notes that it is "not excessive".  And she mostly changes as for  hygiene purposes.  She notes that she feels much better after receiving her iron infusions and notes that she "did not know how tired it was" until she received the IV iron.  Operative discussion she is a very active person and runs. She also developed a stress fracture in her foot has not been doing this as frequently.  She notes that she does see some blood on occasion in her urine but reports that she does have history of kidney stones.  She notes that she had a maternal uncle had some form of "blood cancer".  And that she has stomach cancer in her maternal grandmother's brother.  She does that she is a never smoker never drinker and currently works on a horse farm.  She does have some issues with lightheadedness and dizziness and hearing loss.  She notes that she has a "sour stomach and brain fog as well as fatigue.  She otherwise denies any fevers, chills, sweats, nausea, vomiting or diarrhea.  Full 10 point ROS is listed below.  MEDICAL HISTORY:  Past Medical History:  Diagnosis Date   Adhesive capsulitis of shoulder    Allergy    Childhood asthma    Heart murmur    History of migraine headaches    started age 67, milder with age. some ocular migraines   Hypercalcemia    noted as cause for kidney stones during pregnancy   IDA (iron deficiency anemia)    Kidney stones    during pregnancy  with daughter; under care of [facility]. Calcium Oxalate   Pain in right shoulder    Placental abruption    chronic, with pregnancy; delivered at 24wk 6 d, lived 6 days, emergency c-section   Shoulder impingement syndrome    Stress fracture of femur    x 2 per patient   Thoracic outlet syndrome    Vegan diet     SURGICAL HISTORY: Past Surgical History:  Procedure Laterality Date   CESAREAN SECTION     NEPHROSTOMY     SHOULDER ARTHROSCOPY Right 12/2012, 07-13-2013   URETERAL STENT PLACEMENT      SOCIAL HISTORY: Social History   Socioeconomic History   Marital status: Married     Spouse name: Not on file   Number of children: Not on file   Years of education: Not on file   Highest education level: Not on file  Occupational History   Not on file  Tobacco Use   Smoking status: Former    Types: Cigarettes    Quit date: 12/30/2000    Years since quitting: 20.6   Smokeless tobacco: Never   Tobacco comments:    occasional use during college  Vaping Use   Vaping Use: Never used  Substance and Sexual Activity   Alcohol use: Not Currently   Drug use: No   Sexual activity: Yes    Partners: Male    Comment: husband with vasectomy  Other Topics Concern   Not on file  Social History Narrative   Married. 314 yo daughter.   Runs a farm, horse-boarding.   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   Run 30 miles/week   Vegan diet (not super strict, will eat some baked goods that has eggs).   From [place], [place]   Social Determinants of Health   Financial Resource Strain: Not on file  Food Insecurity: Not on file  Transportation Needs: Not on file  Physical Activity: Not on file  Stress: Not on file  Social Connections: Not on file  Intimate Partner Violence: Not on file    FAMILY HISTORY: Family History  Problem Relation Age of Onset   Hyperlipidemia Mother    Hypothyroidism Mother    Hyperthyroidism Maternal Uncle    Hyperthyroidism Maternal Grandmother    Heart disease Maternal Grandmother        died 7462 from MI   Heart disease Maternal Grandfather        CABG    Allergies Daughter    Cancer Neg Hx    Colon cancer Neg Hx    Pancreatic cancer Neg Hx    Esophageal cancer Neg Hx    Colon polyps Neg Hx    Rectal cancer Neg Hx    Stomach cancer Neg Hx     ALLERGIES:  is allergic to cephalexin, codeine, darvocet a500 [propoxyphene n-acetaminophen], dilaudid [hydromorphone hcl], hydrocodone-acetaminophen, latex, macrobid [nitrofurantoin macrocrystal], morphine sulfate, sulfa antibiotics, and tape.  MEDICATIONS:  Current Outpatient Medications  Medication  Sig Dispense Refill   pantoprazole (PROTONIX) 40 MG tablet Take 1 tablet (40 mg total) by mouth daily. 90 tablet 1   No current facility-administered medications for this visit.    REVIEW OF SYSTEMS:   Constitutional: ( - ) fevers, ( - )  chills , ( - ) night sweats Eyes: ( - ) blurriness of vision, ( - ) double vision, ( - ) watery eyes Ears, nose, mouth, throat, and face: ( - ) mucositis, ( - ) sore throat Respiratory: ( - ) cough, ( - )  dyspnea, ( - ) wheezes Cardiovascular: ( - ) palpitation, ( - ) chest discomfort, ( - ) lower extremity swelling Gastrointestinal:  ( - ) nausea, ( - ) heartburn, ( - ) change in bowel habits Skin: ( - ) abnormal skin rashes Lymphatics: ( - ) new lymphadenopathy, ( - ) easy bruising Neurological: ( - ) numbness, ( - ) tingling, ( - ) new weaknesses Behavioral/Psych: ( - ) mood change, ( - ) new changes  All other systems were reviewed with the patient and are negative.  PHYSICAL EXAMINATION: ECOG PERFORMANCE STATUS: 1 - Symptomatic but completely ambulatory  Vitals:   08/20/21 0940  BP: 124/85  Pulse: 61  Resp: 16  Temp: 97.6 F (36.4 C)  SpO2: 100%   Filed Weights   08/20/21 0940  Weight: 108 lb (49 kg)    GENERAL: well appearing young Caucasian female in NAD  SKIN: skin color, texture, turgor are normal, no rashes or significant lesions EYES: conjunctiva are pink and non-injected, sclera clear LUNGS: clear to auscultation and percussion with normal breathing effort HEART: regular rate & rhythm and no murmurs and no lower extremity edema Musculoskeletal: no cyanosis of digits and no clubbing  PSYCH: alert & oriented x 3, fluent speech NEURO: no focal motor/sensory deficits  LABORATORY DATA:  I have reviewed the data as listed CBC Latest Ref Rng & Units 08/20/2021 06/29/2021 05/08/2021  WBC 4.0 - 10.5 K/uL 6.0 5.7 4.5  Hemoglobin 12.0 - 15.0 g/dL 16.1 09.6 0.4(V)  Hematocrit 36.0 - 46.0 % 42.8 38.3 31.2(L)  Platelets 150 - 400 K/uL  205 210.0 323.0    CMP Latest Ref Rng & Units 08/20/2021 07/24/2021 06/29/2021  Glucose 70 - 99 mg/dL 94 - 89  BUN 6 - 20 mg/dL 18 - 16  Creatinine 4.09 - 1.00 mg/dL 8.11 - 9.14  Sodium 782 - 145 mmol/L 141 - 139  Potassium 3.5 - 5.1 mmol/L 4.7 - 4.1  Chloride 98 - 111 mmol/L 104 - 101  CO2 22 - 32 mmol/L 28 - 31  Calcium 8.9 - 10.3 mg/dL 9.9 8.6 95.6  Total Protein 6.5 - 8.1 g/dL 7.6 - -  Total Bilirubin 0.3 - 1.2 mg/dL 0.3 - -  Alkaline Phos 38 - 126 U/L 45 - -  AST 15 - 41 U/L 27 - -  ALT 0 - 44 U/L 30 - -    RADIOGRAPHIC STUDIES: No results found.  ASSESSMENT & PLAN EVETT KASSA 40 y.o. female with medical history significant for hypercalcemia, veganism, thoracic outlet syndrome, and migraines who presents for evaluation of iron deficiency anemia.   After review of the labs, review of the records, and discussion with the patient the patients findings are most consistent with iron deficiency anemia secondary to vegan diet and menstrual cycles.  No evidence of GI bleeding on EGD and colonoscopy.  She is currently being scheduled for a capsule endoscopy.  She has tried p.o. iron therapy before in the past with unfortunate GI upset.  She responded well to her IV iron sucrose 500 mg x 2 doses.  Given her normal hemoglobin there is no clear indication for repeat IV iron dosing at this time.  Recommend we see her back approximate 3 months in order to reevaluate.  # Iron Deficiency Anemia  -- no evidence of GI Bleeding on EGD or colonoscopy. Agree with capsule endoscopy --patient not able to tolerate PO iron therapy due to GI upset --patient responded well to IV iron sucrose 500mg   x 2 doses.  --No clear indication for repeat IV iron given her normal hemoglobin --Encouraged iron rich foods in her diet.  --Return to clinic in 3 months time.  Orders Placed This Encounter  Procedures   CBC with Differential (Cancer Center Only)    Standing Status:   Future    Number of Occurrences:   1     Standing Expiration Date:   08/20/2022   CMP (Cancer Center only)    Standing Status:   Future    Number of Occurrences:   1    Standing Expiration Date:   08/20/2022   Ferritin    Standing Status:   Future    Number of Occurrences:   1    Standing Expiration Date:   08/20/2022   Iron and TIBC    Standing Status:   Future    Number of Occurrences:   1    Standing Expiration Date:   08/20/2022   Retic Panel    Standing Status:   Future    Number of Occurrences:   1    Standing Expiration Date:   08/20/2022   Lactate dehydrogenase (LDH)    Standing Status:   Future    Number of Occurrences:   1    Standing Expiration Date:   08/20/2022   Copper, serum    Standing Status:   Future    Number of Occurrences:   1    Standing Expiration Date:   08/20/2022   Save Smear [facility])    Standing Status:   Future    Number of Occurrences:   1    Standing Expiration Date:   08/20/2022   Haptoglobin    Standing Status:   Future    Number of Occurrences:   1    Standing Expiration Date:   08/20/2022    All questions were answered. The patient knows to call the clinic with any problems, questions or concerns.  A total of more than 60 minutes were spent on this encounter with face-to-face time and non-face-to-face time, including preparing to see the patient, ordering tests and/or medications, counseling the patient and coordination of care as outlined above.   Ulysees Barns, MD Department of Hematology/Oncology [facility] Phone: [phone] Pager: [phone] Email: [email or web address]  08/28/2021 10:10 AM

## 2021-08-21 LAB — COPPER, SERUM: Copper: 100 ug/dL (ref 80–158)

## 2021-08-21 LAB — HAPTOGLOBIN: Haptoglobin: 66 mg/dL (ref 33–278)

## 2021-08-23 ENCOUNTER — Encounter: Payer: Self-pay | Admitting: Hematology and Oncology

## 2021-08-24 ENCOUNTER — Telehealth: Payer: Self-pay

## 2021-08-24 NOTE — Telephone Encounter (Signed)
Pt notified of results. Pt stated that she already knew the results and has been in touch with the hematologist office.

## 2021-08-24 NOTE — Telephone Encounter (Signed)
-----   Message from Beverley Fiedler, MD sent at 08/23/2021  5:13 PM EDT ----- Regarding: VCE Amanda [name] There was a VCE done this week for IDA It is negative Please let her know Normal small bowel No GI source for IDA She has been referred to hematology Thanks JMP

## 2021-08-28 ENCOUNTER — Encounter: Payer: Self-pay | Admitting: Internal Medicine

## 2021-08-28 ENCOUNTER — Telehealth: Payer: Self-pay | Admitting: Hematology and Oncology

## 2021-08-28 NOTE — Telephone Encounter (Signed)
Called pt to sch appt per 8/30 sch msg. Spoke to pt and she told me  she was supposed to receive a call with her results and wanted to hold off scheduling any follow up appointments until she receives that call. I sent a msg to the desk nurse to reach out to pt.

## 2021-08-30 ENCOUNTER — Telehealth: Payer: Self-pay | Admitting: *Deleted

## 2021-08-30 NOTE — Telephone Encounter (Signed)
Received call from pt regarding lab results.  Reviewed labs with her. Advised that her iron is low but she is not anemic. Dr. Leonides Schanz recommends oral iron every other day and to take at a different time than her Protonix and take with a source of vitamin C. Pt voiced understanding.  She is to return in 3 months.

## 2021-09-19 ENCOUNTER — Telehealth: Payer: Self-pay | Admitting: Internal Medicine

## 2021-09-24 ENCOUNTER — Telehealth: Payer: Self-pay | Admitting: Hematology and Oncology

## 2021-09-24 NOTE — Telephone Encounter (Signed)
Scheduled per sch msg. Called, not able to leave msg. Mailed printout  °

## 2021-12-18 ENCOUNTER — Telehealth: Payer: Self-pay | Admitting: Internal Medicine

## 2021-12-18 NOTE — Telephone Encounter (Signed)
Patient called requesting to speak with the nurse about her labwork that was "coded wrong."  She said her insurance wouldn't pay for it and that this issue has been discussed before in the past.  They have now sent her to collections and she wants the matter resolved urgently.  Please call patient and advise.

## 2021-12-18 NOTE — Telephone Encounter (Signed)
Pt states she is going to call her insurance company and give them permission to speak with me to see if we can get the VIt d level that was drawn in July.

## 2021-12-20 ENCOUNTER — Telehealth: Payer: Commercial Managed Care - PPO | Admitting: Physician Assistant

## 2021-12-20 DIAGNOSIS — L03119 Cellulitis of unspecified part of limb: Secondary | ICD-10-CM | POA: Diagnosis not present

## 2021-12-20 NOTE — Progress Notes (Signed)
Virtual Visit Consent   ALEIRA [name], you are scheduled for a virtual visit with a [facility] provider today.     Just as with appointments in the office, your consent must be obtained to participate.  Your consent will be active for this visit and any virtual visit you may have with one of our providers in the next 365 days.     If you have a MyChart account, a copy of this consent can be sent to you electronically.  All virtual visits are billed to your insurance company just like a traditional visit in the office.    As this is a virtual visit, video technology does not allow for your provider to perform a traditional examination.  This may limit your provider's ability to fully assess your condition.  If your provider identifies any concerns that need to be evaluated in person or the need to arrange testing (such as labs, EKG, etc.), we will make arrangements to do so.     Although advances in technology are sophisticated, we cannot ensure that it will always work on either your end or our end.  If the connection with a video visit is poor, the visit may have to be switched to a telephone visit.  With either a video or telephone visit, we are not always able to ensure that we have a secure connection.     I need to obtain your verbal consent now.   Are you willing to proceed with your visit today?    [name] has provided verbal consent on 12/20/2021 for a virtual visit (video or telephone).   [name], [place]   Date: 12/20/2021 1:31 PM   Virtual Visit via Video Note   I, [name], connected with  Amanda [name]  ([id number], 02-14-1981) on 12/20/21 at  1:15 PM EST by a video-enabled telemedicine application and verified that I am speaking with the correct person using two identifiers.  Location: Patient: Virtual Visit Location Patient: Home Provider: Virtual Visit Location Provider: Home Office   I discussed the limitations of evaluation and  management by telemedicine and the availability of in person appointments. The patient expressed understanding and agreed to proceed.    History of Present Illness: Amanda [name] is a 40 y.o. who identifies as a female who was assigned female at birth, and is being seen today for possible spider bite of L foot within past couple of days after running in the woods. Was doing her walk/run and felt a small "bite" sensation on the foot. Checked and saw a small red ara but could not find culprit. Continued her run. Over the next 24 hours, noted increased redness at site with small blister formation. She notes she went for another run which irritated the area a bit further. Blister is gone now but she is left with small opening that is now scabbing over. Unfortunately noting increased redness, tenderness and warmth of skin around area. Denies fever, chills, decreased ROM of extremity.  HPI: HPI  Problems:  Patient Active Problem List   Diagnosis Date Noted   Iron deficiency anemia 05/08/2021   Femoral neck stress fracture, initial encounter 03/17/2019   Disturbance of skin sensation 11/22/2013   UPPER RESPIRATORY INFECTION, VIRAL 05/26/2008   HYPERLIPIDEMIA 12/04/2006   ANEMIA-NOS 12/04/2006   MIGRAINE HEADACHE 12/04/2006   ALLERGIC RHINITIS 12/04/2006   ASTHMA 12/04/2006   PLACENTA ABRUPTIO 12/04/2006    Allergies:  Allergies  Allergen Reactions  Cephalexin Swelling   Codeine    Darvocet A500 [Propoxyphene N-Acetaminophen]    Dilaudid [Hydromorphone Hcl]    Hydrocodone-Acetaminophen    Latex    Macrobid [Nitrofurantoin Macrocrystal]    Morphine Sulfate    Sulfa Antibiotics    Tape Rash and Swelling   Medications: No current outpatient medications on file.  Observations/Objective: Patient is well-developed, well-nourished in no acute distress.  Resting comfortably at home.  Head is normocephalic, atraumatic.  No labored breathing. Speech is clear and coherent with logical  content.  Patient is alert and oriented at baseline.  L anterior forefoot with small 1 cm superficial ulceration with erythematous base, at site of possible bite. Some mild swelling and redness surrounding the area in an annular shape, extending to mid forefoot.   Assessment and Plan: 1. Cellulitis of foot  Secondary to recent bite, likely spider, with subsequent blister and sloughing from friction while running. Mild infection present. No alarm signs/symptoms. Supportive measures and wound care discussed. Rx Augmentin BID x 7 days giving her other ABX allergies/intolerances. Strict follow-up precautions and ER precautions reviewed.   Follow Up Instructions: I discussed the assessment and treatment plan with the patient. The patient was provided an opportunity to ask questions and all were answered. The patient agreed with the plan and demonstrated an understanding of the instructions.  A copy of instructions were sent to the patient via MyChart unless otherwise noted below.   The patient was advised to call back or seek an in-person evaluation if the symptoms worsen or if the condition fails to improve as anticipated.  Time:  I spent 12 minutes with the patient via telehealth technology discussing the above problems/concerns.    [name], PA-C

## 2021-12-20 NOTE — Patient Instructions (Addendum)
°  Amanda [name], thank you for joining [name], PA-C for today's virtual visit.  While this provider is not your primary care provider (PCP), if your PCP is located in our provider database this encounter information will be shared with them immediately following your visit.  Consent: (Patient) Amanda [name] provided verbal consent for this virtual visit at the beginning of the encounter.  Current Medications:  Current Outpatient Medications:    pantoprazole (PROTONIX) 40 MG tablet, Take 1 tablet (40 mg total) by mouth daily., Disp: 90 tablet, Rfl: 1   Medications ordered in this encounter:  No orders of the defined types were placed in this encounter.    *If you need refills on other medications prior to your next appointment, please contact your pharmacy*  Follow-Up: Call back or seek an in-person evaluation if the symptoms worsen or if the condition fails to improve as anticipated.  Other Instructions Please keep skin clean and dry. Draw the new line around the red area as discussed, once starting the antibiotic. Take antibiotic as directed. Elevate leg while resting. Try to avoid wearing footwear that will rub tightly against the area of concern.  After being on antibiotic for 24 hours we should not see the redness spreading further. If so, I want you to be evaluated in person.    If you have been instructed to have an in-person evaluation today at a local Urgent Care facility, please use the link below. It will take you to a list of all of our available [facility], including address, phone number and hours of operation. Please do not delay care.  [facility]  If you or a family member do not have a primary care provider, use the link below to schedule a visit and establish care. When you choose a [facility] primary care physician or advanced practice provider, you gain a long-term partner in health. Find a Primary Care Provider  Learn  more about [facility]'s in-office and virtual care options:  - Get Care Now

## 2021-12-27 ENCOUNTER — Telehealth: Payer: Commercial Managed Care - PPO | Admitting: Family Medicine

## 2022-01-02 ENCOUNTER — Ambulatory Visit: Payer: Commercial Managed Care - PPO | Admitting: Hematology and Oncology

## 2022-01-02 ENCOUNTER — Other Ambulatory Visit: Payer: Commercial Managed Care - PPO

## 2022-01-09 ENCOUNTER — Inpatient Hospital Stay: Payer: Commercial Managed Care - PPO | Admitting: Hematology and Oncology

## 2022-01-09 ENCOUNTER — Inpatient Hospital Stay: Payer: Commercial Managed Care - PPO

## 2022-01-11 ENCOUNTER — Other Ambulatory Visit: Payer: Self-pay | Admitting: *Deleted

## 2022-01-13 ENCOUNTER — Other Ambulatory Visit: Payer: Self-pay | Admitting: Hematology and Oncology

## 2022-01-13 DIAGNOSIS — D508 Other iron deficiency anemias: Secondary | ICD-10-CM

## 2022-01-13 NOTE — Progress Notes (Signed)
[facility] [phone]   [phone]  PROGRESS NOTE  Patient Care Team: Rita Ohara, MD as PCP - General (Family Medicine)  Hematological/Oncological History # Iron Deficiency Anemia of Unclear Etiology  05/08/2021: WBC 4.5, Hgb 9.7, MCV 64.8, Plt 323, Iron 14, Sat 2.4% 05/24/2021: Iron Sucrose 500 mg x 2 doses 06/29/2021: WBC 5.7, Hgb 12.6, MCV 82, Plt 210. Iron 89, Sat 21.3% 07/24/2021: EGD performed, no evidence of celiac disease or clear source of bleeding. Iron Sat 16%, ferritin 19.9 08/20/2021: establish care with Dr. Lorenso Courier   Interval History:  Amanda [name] 41 y.o. female with medical history significant for iron deficiency anemia of unclear etiology who presents for a follow up visit. The patient's last visit was on 08/20/2021. In the interim since the last visit she underwent a capsule endoscopy on 08/16/2021 with no clear source of bleeding identified.   On exam today Mr. Heiden reports that she has "been okay".  She notes that she began taking magnesium and began getting a boost in her energy levels.  She is taking 200 mg twice daily and is not causing any stomach upset.  She notes she is made no other changes in her lifestyle or diet.  She notes her energy is about a 9 out of 10 and that before taking magnesium it was about a 5 out of 10.  She is concerned that her ferritin remains so low.  She denies any overt signs of bleeding such as nosebleeds, gum bleeding, or dark stools.  She notes that she would like to pursue IV iron therapy if her ferritin levels remain low.  She otherwise denies any fevers, chills, sweats, nausea, vomiting or diarrhea.  Full 10 point ROS is listed below.  MEDICAL HISTORY:  Past Medical History:  Diagnosis Date   Adhesive capsulitis of shoulder    Allergy    Childhood asthma    Heart murmur    History of migraine headaches    started age 17, milder with age. some ocular migraines   Hypercalcemia    noted as cause for  kidney stones during pregnancy   IDA (iron deficiency anemia)    Kidney stones    during pregnancy with daughter; under care of [facility]. Calcium Oxalate   Pain in right shoulder    Placental abruption    chronic, with pregnancy; delivered at 24wk 6 d, lived 6 days, emergency c-section   Shoulder impingement syndrome    Stress fracture of femur    x 2 per patient   Thoracic outlet syndrome    Vegan diet     SURGICAL HISTORY: Past Surgical History:  Procedure Laterality Date   CESAREAN SECTION     NEPHROSTOMY     SHOULDER ARTHROSCOPY Right 12/2012, 07-13-2013   URETERAL STENT PLACEMENT      SOCIAL HISTORY: Social History   Socioeconomic History   Marital status: Married    Spouse name: Not on file   Number of children: Not on file   Years of education: Not on file   Highest education level: Not on file  Occupational History   Not on file  Tobacco Use   Smoking status: Former    Types: Cigarettes    Quit date: 12/30/2000    Years since quitting: 21.0   Smokeless tobacco: Never   Tobacco comments:    occasional use during college  Vaping Use   Vaping Use: Never used  Substance and Sexual Activity   Alcohol use: Not  Currently   Drug use: No   Sexual activity: Yes    Partners: Male    Comment: husband with vasectomy  Other Topics Concern   Not on file  Social History Narrative   Married. 41 yo daughter.   Runs a farm, horse-boarding.   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   Run 30 miles/week   Vegan diet (not super strict, will eat some baked goods that has eggs).   From [place], [place]   Social Determinants of Health   Financial Resource Strain: Not on file  Food Insecurity: Not on file  Transportation Needs: Not on file  Physical Activity: Not on file  Stress: Not on file  Social Connections: Not on file  Intimate Partner Violence: Not on file    FAMILY HISTORY: Family History  Problem Relation Age of Onset   Hyperlipidemia Mother     Hypothyroidism Mother    Hyperthyroidism Maternal Uncle    Hyperthyroidism Maternal Grandmother    Heart disease Maternal Grandmother        died 2562 from MI   Heart disease Maternal Grandfather        CABG    Allergies Daughter    Cancer Neg Hx    Colon cancer Neg Hx    Pancreatic cancer Neg Hx    Esophageal cancer Neg Hx    Colon polyps Neg Hx    Rectal cancer Neg Hx    Stomach cancer Neg Hx     ALLERGIES:  is allergic to cephalexin, codeine, darvocet a500 [propoxyphene n-acetaminophen], dilaudid [hydromorphone hcl], hydrocodone-acetaminophen, latex, macrobid [nitrofurantoin macrocrystal], morphine sulfate, sulfa antibiotics, and tape.  MEDICATIONS:  No current outpatient medications on file.   No current facility-administered medications for this visit.    REVIEW OF SYSTEMS:   Constitutional: ( - ) fevers, ( - )  chills , ( - ) night sweats Eyes: ( - ) blurriness of vision, ( - ) double vision, ( - ) watery eyes Ears, nose, mouth, throat, and face: ( - ) mucositis, ( - ) sore throat Respiratory: ( - ) cough, ( - ) dyspnea, ( - ) wheezes Cardiovascular: ( - ) palpitation, ( - ) chest discomfort, ( - ) lower extremity swelling Gastrointestinal:  ( - ) nausea, ( - ) heartburn, ( - ) change in bowel habits Skin: ( - ) abnormal skin rashes Lymphatics: ( - ) new lymphadenopathy, ( - ) easy bruising Neurological: ( - ) numbness, ( - ) tingling, ( - ) new weaknesses Behavioral/Psych: ( - ) mood change, ( - ) new changes  All other systems were reviewed with the patient and are negative.  PHYSICAL EXAMINATION:  Vitals:   01/14/22 0812  BP: 117/70  Pulse: (!) 56  Resp: 18  Temp: (!) 97.5 F (36.4 C)  SpO2: 100%   Filed Weights   01/14/22 0812  Weight: 110 lb 3 oz (50 kg)    GENERAL: alert, no distress and comfortable SKIN: skin color, texture, turgor are normal, no rashes or significant lesions EYES: conjunctiva are pink and non-injected, sclera clear LUNGS: clear to  auscultation and percussion with normal breathing effort HEART: regular rate & rhythm and no murmurs and no lower extremity edema Musculoskeletal: no cyanosis of digits and no clubbing  PSYCH: alert & oriented x 3, fluent speech NEURO: no focal motor/sensory deficits  LABORATORY DATA:  I have reviewed the data as listed CBC Latest Ref Rng & Units 01/14/2022 08/20/2021 06/29/2021  WBC 4.0 - 10.5 K/uL 5.6  6.0 5.7  Hemoglobin 12.0 - 15.0 g/dL 13.8 14.3 12.6  Hematocrit 36.0 - 46.0 % 41.9 42.8 38.3  Platelets 150 - 400 K/uL 253 205 210.0    CMP Latest Ref Rng & Units 08/20/2021 07/24/2021 06/29/2021  Glucose 70 - 99 mg/dL 94 - 89  BUN 6 - 20 mg/dL 18 - 16  Creatinine 0.44 - 1.00 mg/dL 0.78 - 0.72  Sodium 135 - 145 mmol/L 141 - 139  Potassium 3.5 - 5.1 mmol/L 4.7 - 4.1  Chloride 98 - 111 mmol/L 104 - 101  CO2 22 - 32 mmol/L 28 - 31  Calcium 8.9 - 10.3 mg/dL 9.9 8.6 10.0  Total Protein 6.5 - 8.1 g/dL 7.6 - -  Total Bilirubin 0.3 - 1.2 mg/dL 0.3 - -  Alkaline Phos 38 - 126 U/L 45 - -  AST 15 - 41 U/L 27 - -  ALT 0 - 44 U/L 30 - -    RADIOGRAPHIC STUDIES: No results found.  ASSESSMENT & PLAN Amanda [name] 41 y.o. female with medical history significant for iron deficiency anemia of unclear etiology who presents for a follow up visit.  After review of the labs, review of the records, and discussion with the patient the patients findings are most consistent with iron deficiency anemia secondary to vegan diet and menstrual cycles.  No evidence of GI bleeding on EGD and colonoscopy.  She is currently being scheduled for a capsule endoscopy.  She has tried p.o. iron therapy before in the past with unfortunate GI upset.  She responded well to her IV iron sucrose 500 mg x 2 doses.  # Iron Deficiency Anemia of Unclear Etiology -- no evidence of GI Bleeding on EGD or colonoscopy. Additionally she had a negative capsule endoscopy --patient not able to tolerate PO iron therapy due to GI  upset --patient responded well to IV iron sucrose 500mg  x 2 doses with robust boost in Hgb, but her ferritin levels remain <30.  --if ferritin levels remain low will pursue IV iron therapy to help bolster her iron stores.  --Encouraged iron rich foods in her diet.  --Return to clinic in 3 months time.  No orders of the defined types were placed in this encounter.   All questions were answered. The patient knows to call the clinic with any problems, questions or concerns.  A total of more than 30 minutes were spent on this encounter with face-to-face time and non-face-to-face time, including preparing to see the patient, ordering tests and/or medications, counseling the patient and coordination of care as outlined above.   Ledell Peoples, MD Department of Hematology/Oncology [place] at [facility] Phone: [phone] Pager: [phone] Email: [email or web address]  01/14/2022 9:44 AM

## 2022-01-14 ENCOUNTER — Other Ambulatory Visit: Payer: Self-pay

## 2022-01-14 ENCOUNTER — Inpatient Hospital Stay (HOSPITAL_BASED_OUTPATIENT_CLINIC_OR_DEPARTMENT_OTHER): Payer: Commercial Managed Care - PPO | Admitting: Hematology and Oncology

## 2022-01-14 ENCOUNTER — Encounter: Payer: Self-pay | Admitting: Hematology and Oncology

## 2022-01-14 ENCOUNTER — Inpatient Hospital Stay: Payer: Commercial Managed Care - PPO

## 2022-01-14 ENCOUNTER — Inpatient Hospital Stay: Payer: Commercial Managed Care - PPO | Attending: Hematology and Oncology

## 2022-01-14 VITALS — BP 117/70 | HR 56 | Temp 97.5°F | Resp 18 | Wt 110.2 lb

## 2022-01-14 DIAGNOSIS — D508 Other iron deficiency anemias: Secondary | ICD-10-CM

## 2022-01-14 DIAGNOSIS — D509 Iron deficiency anemia, unspecified: Secondary | ICD-10-CM | POA: Insufficient documentation

## 2022-01-14 DIAGNOSIS — Z87891 Personal history of nicotine dependence: Secondary | ICD-10-CM | POA: Insufficient documentation

## 2022-01-14 LAB — CMP (CANCER CENTER ONLY)
ALT: 31 U/L (ref 0–44)
AST: 38 U/L (ref 15–41)
Albumin: 4.8 g/dL (ref 3.5–5.0)
Alkaline Phosphatase: 48 U/L (ref 38–126)
Anion gap: 9 (ref 5–15)
BUN: 17 mg/dL (ref 6–20)
CO2: 25 mmol/L (ref 22–32)
Calcium: 9.1 mg/dL (ref 8.9–10.3)
Chloride: 102 mmol/L (ref 98–111)
Creatinine: 0.65 mg/dL (ref 0.44–1.00)
GFR, Estimated: >60 mL/min (ref >60)
Glucose, Bld: 118 mg/dL — ABNORMAL HIGH (ref 70–99)
Potassium: 3.8 mmol/L (ref 3.5–5.1)
Sodium: 136 mmol/L (ref 135–145)
Total Bilirubin: 0.3 mg/dL (ref 0.3–1.2)
Total Protein: 7.3 g/dL (ref 6.5–8.1)

## 2022-01-14 LAB — IRON AND IRON BINDING CAPACITY (CC-WL,HP ONLY)
Iron: 41 ug/dL (ref 28–170)
Saturation Ratios: 9 % — ABNORMAL LOW (ref 10.4–31.8)
TIBC: 449 ug/dL (ref 250–450)
UIBC: 408 ug/dL (ref 148–442)

## 2022-01-14 LAB — CBC WITH DIFFERENTIAL (CANCER CENTER ONLY)
Abs Immature Granulocytes: 0.01 10*3/uL (ref 0.00–0.07)
Basophils Absolute: 0.1 10*3/uL (ref 0.0–0.1)
Basophils Relative: 1 %
Eosinophils Absolute: 0.2 10*3/uL (ref 0.0–0.5)
Eosinophils Relative: 3 %
HCT: 41.9 % (ref 36.0–46.0)
Hemoglobin: 13.8 g/dL (ref 12.0–15.0)
Immature Granulocytes: 0 %
Lymphocytes Relative: 22 %
Lymphs Abs: 1.2 10*3/uL (ref 0.7–4.0)
MCH: 30.1 pg (ref 26.0–34.0)
MCHC: 32.9 g/dL (ref 30.0–36.0)
MCV: 91.5 fL (ref 80.0–100.0)
Monocytes Absolute: 0.4 10*3/uL (ref 0.1–1.0)
Monocytes Relative: 7 %
Neutro Abs: 3.7 10*3/uL (ref 1.7–7.7)
Neutrophils Relative %: 67 %
Platelet Count: 253 10*3/uL (ref 150–400)
RBC: 4.58 MIL/uL (ref 3.87–5.11)
RDW: 13.2 % (ref 11.5–15.5)
WBC Count: 5.6 10*3/uL (ref 4.0–10.5)
nRBC: 0 % (ref 0.0–0.2)

## 2022-01-14 LAB — RETIC PANEL
Immature Retic Fract: 10.4 % (ref 2.3–15.9)
RBC.: 4.58 MIL/uL (ref 3.87–5.11)
Retic Count, Absolute: 35.7 10*3/uL (ref 19.0–186.0)
Retic Ct Pct: 0.8 % (ref 0.4–3.1)
Reticulocyte Hemoglobin: 35.2 pg (ref >27.9)

## 2022-01-14 LAB — FERRITIN: Ferritin: 7 ng/mL — ABNORMAL LOW (ref 11–307)

## 2022-01-15 ENCOUNTER — Telehealth: Payer: Self-pay | Admitting: Hematology and Oncology

## 2022-01-15 NOTE — Telephone Encounter (Signed)
Sch per 1/16 inbasket, pt aware °

## 2022-01-18 ENCOUNTER — Inpatient Hospital Stay (HOSPITAL_BASED_OUTPATIENT_CLINIC_OR_DEPARTMENT_OTHER): Payer: Commercial Managed Care - PPO

## 2022-01-18 ENCOUNTER — Other Ambulatory Visit: Payer: Self-pay

## 2022-01-18 VITALS — BP 107/66 | HR 53 | Temp 98.9°F | Resp 16

## 2022-01-18 DIAGNOSIS — D508 Other iron deficiency anemias: Secondary | ICD-10-CM

## 2022-01-18 DIAGNOSIS — D509 Iron deficiency anemia, unspecified: Secondary | ICD-10-CM | POA: Diagnosis not present

## 2022-01-18 MED ORDER — SODIUM CHLORIDE 0.9 % IV SOLN
300.0000 mg | Freq: Once | INTRAVENOUS | Status: AC
  Administered 2022-01-18: 300 mg via INTRAVENOUS
  Filled 2022-01-18: qty 300

## 2022-01-18 MED ORDER — SODIUM CHLORIDE 0.9 % IV SOLN: Freq: Once | INTRAVENOUS | Status: AC

## 2022-01-18 NOTE — Patient Instructions (Signed)

## 2022-01-21 ENCOUNTER — Telehealth: Payer: Self-pay | Admitting: *Deleted

## 2022-01-21 NOTE — Telephone Encounter (Signed)
Called pt this am after receiving on call message on her. Pt had called over the weekend  with c/o tachycardia and chest tightness the day after her IV iron infusion. Pt had also run a half marathon Saturday am. Her Iron infusion was the Friday morning before her run.  She states her heart was up to 150. Her resting heart rate is normally in the 50's. She also had redness and swelling at the IV site. She took benadryl Saturday night and the redness and swelling resolved..  Pt is receiving Venofer but has only 2 infusions scheduled instead of the usual 5   Please advise if pt needs pre-meds and if she needs the 3 additional infusions scheduled.

## 2022-01-22 NOTE — Telephone Encounter (Signed)
Please schedule an additional 3 sessions and provide benadryl 50 mg PO and tylenol 650 mg prior to administration.

## 2022-01-25 ENCOUNTER — Other Ambulatory Visit: Payer: Self-pay | Admitting: Hematology and Oncology

## 2022-01-25 ENCOUNTER — Inpatient Hospital Stay: Payer: Commercial Managed Care - PPO

## 2022-01-25 ENCOUNTER — Other Ambulatory Visit: Payer: Self-pay

## 2022-01-25 VITALS — BP 109/54 | HR 63 | Temp 98.3°F | Resp 17

## 2022-01-25 DIAGNOSIS — D509 Iron deficiency anemia, unspecified: Secondary | ICD-10-CM | POA: Diagnosis not present

## 2022-01-25 DIAGNOSIS — D508 Other iron deficiency anemias: Secondary | ICD-10-CM

## 2022-01-25 LAB — TSH: TSH: 0.808 u[IU]/mL (ref 0.308–3.960)

## 2022-01-25 MED ORDER — DIPHENHYDRAMINE HCL 25 MG PO CAPS
50.0000 mg | ORAL_CAPSULE | Freq: Once | ORAL | Status: AC
  Administered 2022-01-25: 50 mg via ORAL
  Filled 2022-01-25: qty 2

## 2022-01-25 MED ORDER — ACETAMINOPHEN 325 MG PO TABS
650.0000 mg | ORAL_TABLET | Freq: Once | ORAL | Status: AC
  Administered 2022-01-25: 650 mg via ORAL
  Filled 2022-01-25: qty 2

## 2022-01-25 MED ORDER — SODIUM CHLORIDE 0.9 % IV SOLN: Freq: Once | INTRAVENOUS | Status: AC

## 2022-01-25 MED ORDER — SODIUM CHLORIDE 0.9 % IV SOLN
300.0000 mg | Freq: Once | INTRAVENOUS | Status: AC
  Administered 2022-01-25: 300 mg via INTRAVENOUS
  Filled 2022-01-25: qty 300

## 2022-01-25 NOTE — Progress Notes (Signed)
Added Tylenol 650mg  po and Benadryl 50 mg po to Venofer treatment per Dr. .  Leonides Schanz [place], AURA, BCPS, BCOP 01/25/2022 10:53 AM

## 2022-01-25 NOTE — Progress Notes (Signed)
Patient declined 30 min post obs.

## 2022-01-25 NOTE — Patient Instructions (Signed)

## 2022-01-27 LAB — T4: T4, Total: 6.7 ug/dL (ref 4.5–12.0)

## 2022-01-27 LAB — T3: T3, Total: 76 ng/dL (ref 71–180)

## 2022-01-31 ENCOUNTER — Other Ambulatory Visit: Payer: Self-pay | Admitting: Hematology and Oncology

## 2022-02-01 ENCOUNTER — Telehealth: Payer: Self-pay

## 2022-02-01 ENCOUNTER — Ambulatory Visit: Payer: Commercial Managed Care - PPO

## 2022-02-01 ENCOUNTER — Inpatient Hospital Stay: Payer: Commercial Managed Care - PPO | Attending: Hematology and Oncology

## 2022-02-01 ENCOUNTER — Other Ambulatory Visit: Payer: Self-pay

## 2022-02-01 VITALS — BP 102/58 | HR 61 | Temp 98.2°F | Resp 19

## 2022-02-01 DIAGNOSIS — D509 Iron deficiency anemia, unspecified: Secondary | ICD-10-CM | POA: Diagnosis not present

## 2022-02-01 DIAGNOSIS — D508 Other iron deficiency anemias: Secondary | ICD-10-CM

## 2022-02-01 MED ORDER — DIPHENHYDRAMINE HCL 50 MG/ML IJ SOLN: 50.0000 mg | Freq: Once | INTRAMUSCULAR | Status: DC

## 2022-02-01 MED ORDER — SODIUM CHLORIDE 0.9 % IV SOLN: Freq: Once | INTRAVENOUS | Status: AC

## 2022-02-01 MED ORDER — ACETAMINOPHEN 325 MG PO TABS
650.0000 mg | ORAL_TABLET | Freq: Once | ORAL | Status: AC
  Administered 2022-02-01: 650 mg via ORAL
  Filled 2022-02-01: qty 2

## 2022-02-01 MED ORDER — SODIUM CHLORIDE 0.9 % IV SOLN
200.0000 mg | Freq: Once | INTRAVENOUS | Status: AC
  Administered 2022-02-01: 200 mg via INTRAVENOUS
  Filled 2022-02-01: qty 200

## 2022-02-01 NOTE — Patient Instructions (Signed)
[facility]  Discharge Instructions: Thank you for choosing [facility] to provide your oncology and hematology care.   If you have a lab appointment with the Cancer Center, please go directly to the Cancer Center and check in at the registration area.   Wear comfortable clothing and clothing appropriate for easy access to any Portacath or PICC line.   We strive to give you quality time with your provider. You may need to reschedule your appointment if you arrive late (15 or more minutes).  Arriving late affects you and other patients whose appointments are after yours.  Also, if you miss three or more appointments without notifying the office, you may be dismissed from the clinic at the providers discretion.      For prescription refill requests, have your pharmacy contact our office and allow 72 hours for refills to be completed.    Today you received the following chemotherapy and/or immunotherapy agents iron sucrose      To help prevent nausea and vomiting after your treatment, we encourage you to take your nausea medication as directed.  BELOW ARE SYMPTOMS THAT SHOULD BE REPORTED IMMEDIATELY: *FEVER GREATER THAN 100.4 F (38 C) OR HIGHER *CHILLS OR SWEATING *NAUSEA AND VOMITING THAT IS NOT CONTROLLED WITH YOUR NAUSEA MEDICATION *UNUSUAL SHORTNESS OF BREATH *UNUSUAL BRUISING OR BLEEDING *URINARY PROBLEMS (pain or burning when urinating, or frequent urination) *BOWEL PROBLEMS (unusual diarrhea, constipation, pain near the anus) TENDERNESS IN MOUTH AND THROAT WITH OR WITHOUT PRESENCE OF ULCERS (sore throat, sores in mouth, or a toothache) UNUSUAL RASH, SWELLING OR PAIN  UNUSUAL VAGINAL DISCHARGE OR ITCHING   Items with * indicate a potential emergency and should be followed up as soon as possible or go to the Emergency Department if any problems should occur.  Please show the CHEMOTHERAPY ALERT CARD or IMMUNOTHERAPY ALERT CARD at check-in  to the Emergency Department and triage nurse.  Should you have questions after your visit or need to cancel or reschedule your appointment, please contact [facility]  Dept: [phone]  and follow the prompts.  Office hours are 8:00 a.m. to 4:30 p.m. Monday - Friday. Please note that voicemails left after 4:00 p.m. may not be returned until the following business day.  We are closed weekends and major holidays. You have access to a nurse at all times for urgent questions. Please call the main number to the clinic Dept: [phone] and follow the prompts.   For any non-urgent questions, you may also contact your provider using MyChart. We now offer e-Visits for anyone 37 and older to request care online for non-urgent symptoms. For details visit [email or web address].   Also download the MyChart app! Go to the app store, search "MyChart", open the app, select [facility], and log in with your MyChart username and password.  Due to Covid, a mask is required upon entering the hospital/clinic. If you do not have a mask, one will be given to you upon arrival. For doctor visits, patients may have 1 support Chevy Sweigert aged 27 or older with them. For treatment visits, patients cannot have anyone with them due to current Covid guidelines and our immunocompromised population.   Iron Sucrose Injection What is this medication? IRON SUCROSE (EYE ern SOO krose) treats low levels of iron (iron deficiency anemia) in people with kidney disease. Iron is a mineral that plays an important role in making red blood cells, which carry oxygen from your lungs to the  rest of your body. This medicine may be used for other purposes; ask your health care provider or pharmacist if you have questions. COMMON BRAND NAME(S): Venofer What should I tell my care team before I take this medication? They need to know if you have any of these conditions: Anemia not caused by low iron levels Heart  disease High levels of iron in the blood Kidney disease Liver disease An unusual or allergic reaction to iron, other medications, foods, dyes, or preservatives Pregnant or trying to get pregnant Breast-feeding How should I use this medication? This medication is for infusion into a vein. It is given in a hospital or clinic setting. Talk to your care team about the use of this medication in children. While this medication may be prescribed for children as young as 2 years for selected conditions, precautions do apply. Overdosage: If you think you have taken too much of this medicine contact a poison control center or emergency room at once. NOTE: This medicine is only for you. Do not share this medicine with others. What if I miss a dose? It is important not to miss your dose. Call your care team if you are unable to keep an appointment. What may interact with this medication? Do not take this medication with any of the following: Deferoxamine Dimercaprol Other iron products This medication may also interact with the following: Chloramphenicol Deferasirox This list may not describe all possible interactions. Give your health care provider a list of all the medicines, herbs, non-prescription drugs, or dietary supplements you use. Also tell them if you smoke, drink alcohol, or use illegal drugs. Some items may interact with your medicine. What should I watch for while using this medication? Visit your care team regularly. Tell your care team if your symptoms do not start to get better or if they get worse. You may need blood work done while you are taking this medication. You may need to follow a special diet. Talk to your care team. Foods that contain iron include: whole grains/cereals, dried fruits, beans, or peas, leafy green vegetables, and organ meats (liver, kidney). What side effects may I notice from receiving this medication? Side effects that you should report to your care team as  soon as possible: Allergic reactions--skin rash, itching, hives, swelling of the face, lips, tongue, or throat Low blood pressure--dizziness, feeling faint or lightheaded, blurry vision Shortness of breath Side effects that usually do not require medical attention (report to your care team if they continue or are bothersome): Flushing Headache Joint pain Muscle pain Nausea Pain, redness, or irritation at injection site This list may not describe all possible side effects. Call your doctor for medical advice about side effects. You may report side effects to FDA at 1-800-FDA-1088. Where should I keep my medication? This medication is given in a hospital or clinic and will not be stored at home. NOTE: This sheet is a summary. It may not cover all possible information. If you have questions about this medicine, talk to your doctor, pharmacist, or health care provider.  2022 Elsevier/Gold Standard (2021-05-11 00:00:00)

## 2022-02-01 NOTE — Telephone Encounter (Signed)
Received message from infusion room that Amanda [name] is requesting a call from office RN. [name] Amanda [name] who reports that after her infusions she has "bursts of increased HR' that reach 154 BPM. She denies feeling any arythmias. She reports not feeling "right" until day 6 after infusion. Amanda [name] reports that she runs 7 miles per day (5 days a week) and drinks 2 cups of expresso each day. Encouarged Amanda [name] to limit running and limit caffeine after infusions. 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 Encouraged pt to call with further questions or concerns. Dr. Leonides Schanz advised.

## 2022-02-01 NOTE — Progress Notes (Signed)
Patient declined 30 minute post observation for her iron today. VSS upon discharge.

## 2022-02-06 ENCOUNTER — Telehealth: Payer: Self-pay | Admitting: *Deleted

## 2022-02-06 NOTE — Telephone Encounter (Signed)
[name] patient regarding lab results. Spoke to patient and advised that her thyroid level are normal. Pt voiced understanding, no questions or concerns

## 2022-02-06 NOTE — Telephone Encounter (Signed)
-----   Message from Jaci Standard, MD sent at 01/27/2022  2:02 PM EST ----- Please let Mrs. [place] know that her thyroid tests have all returned as normal.   ----- Message ----- From: Interface, Lab In [place] Sent: 01/25/2022  11:19 AM EST To: Jaci Standard, MD

## 2022-02-08 ENCOUNTER — Inpatient Hospital Stay: Payer: Commercial Managed Care - PPO

## 2022-02-08 ENCOUNTER — Ambulatory Visit: Payer: Commercial Managed Care - PPO

## 2022-02-08 ENCOUNTER — Other Ambulatory Visit: Payer: Self-pay

## 2022-02-08 VITALS — BP 108/63 | HR 65 | Temp 98.3°F | Resp 16

## 2022-02-08 DIAGNOSIS — D508 Other iron deficiency anemias: Secondary | ICD-10-CM

## 2022-02-08 DIAGNOSIS — D509 Iron deficiency anemia, unspecified: Secondary | ICD-10-CM | POA: Diagnosis not present

## 2022-02-08 MED ORDER — SODIUM CHLORIDE 0.9 % IV SOLN
200.0000 mg | Freq: Once | INTRAVENOUS | Status: AC
  Administered 2022-02-08: 200 mg via INTRAVENOUS
  Filled 2022-02-08: qty 200

## 2022-02-08 MED ORDER — SODIUM CHLORIDE 0.9 % IV SOLN: Freq: Once | INTRAVENOUS | Status: AC

## 2022-02-08 MED ORDER — DIPHENHYDRAMINE HCL 50 MG/ML IJ SOLN: 50.0000 mg | Freq: Once | INTRAMUSCULAR | Status: DC

## 2022-02-08 MED ORDER — ACETAMINOPHEN 325 MG PO TABS: 650.0000 mg | ORAL_TABLET | Freq: Once | ORAL | Status: DC

## 2022-02-08 NOTE — Progress Notes (Signed)
Pt declined to stay for 30 minutes post Venofer obs.  Pt tolerated trtmt well w/out incident.  VSS at discharge.  Ambulatory to lobby.

## 2022-02-08 NOTE — Patient Instructions (Signed)

## 2022-02-08 NOTE — Progress Notes (Signed)
Pt informed RN that she took PO benadryl and tylenol at home prior to inf appt today

## 2022-02-14 ENCOUNTER — Encounter: Payer: Self-pay | Admitting: Hematology and Oncology

## 2022-02-15 ENCOUNTER — Telehealth: Payer: Self-pay | Admitting: *Deleted

## 2022-02-15 ENCOUNTER — Ambulatory Visit: Payer: Commercial Managed Care - PPO

## 2022-02-15 ENCOUNTER — Inpatient Hospital Stay: Payer: Commercial Managed Care - PPO

## 2022-02-15 NOTE — Telephone Encounter (Signed)
Call made to patient to advise that she does not need to come in for labs next week. We will re-check labs and Dr. Leonides Schanz will see her in April, as scheduled.  Pt is concerned that she is still having side effects from her iron infusion last week (she cancelled today's iron infusion due to ongoing side effects.)  She states she is still feeling that her heart rate is too fast, she has black stools, muscle aches. Generally she feels unwell. She states she felt better before she got any if the IV iron.  She is asking how long these side effects will last.   Please advise

## 2022-02-19 ENCOUNTER — Inpatient Hospital Stay: Payer: Commercial Managed Care - PPO

## 2022-02-22 ENCOUNTER — Other Ambulatory Visit: Payer: Commercial Managed Care - PPO

## 2022-04-15 ENCOUNTER — Inpatient Hospital Stay: Payer: Commercial Managed Care - PPO | Attending: Hematology and Oncology

## 2022-04-15 ENCOUNTER — Telehealth: Payer: Self-pay | Admitting: *Deleted

## 2022-04-15 ENCOUNTER — Other Ambulatory Visit: Payer: Commercial Managed Care - PPO

## 2022-04-15 ENCOUNTER — Other Ambulatory Visit: Payer: Self-pay

## 2022-04-15 ENCOUNTER — Inpatient Hospital Stay: Payer: Commercial Managed Care - PPO | Admitting: Hematology and Oncology

## 2022-04-15 ENCOUNTER — Other Ambulatory Visit: Payer: Self-pay | Admitting: Hematology and Oncology

## 2022-04-15 DIAGNOSIS — D509 Iron deficiency anemia, unspecified: Secondary | ICD-10-CM | POA: Insufficient documentation

## 2022-04-15 DIAGNOSIS — D508 Other iron deficiency anemias: Secondary | ICD-10-CM

## 2022-04-15 LAB — CBC WITH DIFFERENTIAL (CANCER CENTER ONLY)
Abs Immature Granulocytes: 0.01 10*3/uL (ref 0.00–0.07)
Basophils Absolute: 0.1 10*3/uL (ref 0.0–0.1)
Basophils Relative: 1 %
Eosinophils Absolute: 0.1 10*3/uL (ref 0.0–0.5)
Eosinophils Relative: 1 %
HCT: 37.8 % (ref 36.0–46.0)
Hemoglobin: 12 g/dL (ref 12.0–15.0)
Immature Granulocytes: 0 %
Lymphocytes Relative: 18 %
Lymphs Abs: 1.2 10*3/uL (ref 0.7–4.0)
MCH: 28.2 pg (ref 26.0–34.0)
MCHC: 31.7 g/dL (ref 30.0–36.0)
MCV: 88.7 fL (ref 80.0–100.0)
Monocytes Absolute: 0.5 10*3/uL (ref 0.1–1.0)
Monocytes Relative: 7 %
Neutro Abs: 5 10*3/uL (ref 1.7–7.7)
Neutrophils Relative %: 73 %
Platelet Count: 280 10*3/uL (ref 150–400)
RBC: 4.26 MIL/uL (ref 3.87–5.11)
RDW: 14.6 % (ref 11.5–15.5)
WBC Count: 6.8 10*3/uL (ref 4.0–10.5)
nRBC: 0 % (ref 0.0–0.2)

## 2022-04-15 LAB — FERRITIN: Ferritin: <4 ng/mL — ABNORMAL LOW (ref 11–307)

## 2022-04-15 LAB — RETIC PANEL
Immature Retic Fract: 14.4 % (ref 2.3–15.9)
RBC.: 4.17 MIL/uL (ref 3.87–5.11)
Retic Count, Absolute: 30 10*3/uL (ref 19.0–186.0)
Retic Ct Pct: 0.7 % (ref 0.4–3.1)
Reticulocyte Hemoglobin: 28.5 pg (ref >27.9)

## 2022-04-15 LAB — CMP (CANCER CENTER ONLY)
ALT: 20 U/L (ref 0–44)
AST: 29 U/L (ref 15–41)
Albumin: 4.7 g/dL (ref 3.5–5.0)
Alkaline Phosphatase: 44 U/L (ref 38–126)
Anion gap: 7 (ref 5–15)
BUN: 16 mg/dL (ref 6–20)
CO2: 30 mmol/L (ref 22–32)
Calcium: 9.7 mg/dL (ref 8.9–10.3)
Chloride: 104 mmol/L (ref 98–111)
Creatinine: 0.68 mg/dL (ref 0.44–1.00)
GFR, Estimated: >60 mL/min (ref >60)
Glucose, Bld: 93 mg/dL (ref 70–99)
Potassium: 3.9 mmol/L (ref 3.5–5.1)
Sodium: 141 mmol/L (ref 135–145)
Total Bilirubin: 0.3 mg/dL (ref 0.3–1.2)
Total Protein: 6.9 g/dL (ref 6.5–8.1)

## 2022-04-15 LAB — IRON AND IRON BINDING CAPACITY (CC-WL,HP ONLY)
Iron: 27 ug/dL — ABNORMAL LOW (ref 28–170)
Saturation Ratios: 6 % — ABNORMAL LOW (ref 10.4–31.8)
TIBC: 490 ug/dL — ABNORMAL HIGH (ref 250–450)
UIBC: 463 ug/dL — ABNORMAL HIGH (ref 148–442)

## 2022-04-15 NOTE — Progress Notes (Signed)
Patient had to leave before being seen. Labs drawn. Visit to be rescheduled.  ?

## 2022-04-15 NOTE — Telephone Encounter (Signed)
Received call from [facility] in registration. Pt was in process of signing in for her appts when she received a call from her daughter's school. Her daughter is sick and needs to be picked up from school. Pt will call to reschedule her appts ?

## 2022-04-16 ENCOUNTER — Encounter: Payer: Self-pay | Admitting: Hematology and Oncology

## 2022-04-18 ENCOUNTER — Telehealth: Payer: Self-pay | Admitting: Hematology and Oncology

## 2022-04-18 NOTE — Telephone Encounter (Signed)
.  Called patient to schedule appointment per 4/20 inbasket, patient is aware of date and time.   ?

## 2022-04-29 ENCOUNTER — Inpatient Hospital Stay: Payer: Commercial Managed Care - PPO | Attending: Hematology and Oncology | Admitting: Hematology and Oncology

## 2022-04-29 ENCOUNTER — Other Ambulatory Visit: Payer: Self-pay

## 2022-04-29 VITALS — BP 116/79 | HR 64 | Temp 97.8°F | Resp 15

## 2022-04-29 DIAGNOSIS — Z87891 Personal history of nicotine dependence: Secondary | ICD-10-CM | POA: Insufficient documentation

## 2022-04-29 DIAGNOSIS — R002 Palpitations: Secondary | ICD-10-CM | POA: Insufficient documentation

## 2022-04-29 DIAGNOSIS — D509 Iron deficiency anemia, unspecified: Secondary | ICD-10-CM | POA: Insufficient documentation

## 2022-04-29 DIAGNOSIS — D508 Other iron deficiency anemias: Secondary | ICD-10-CM

## 2022-04-29 DIAGNOSIS — R000 Tachycardia, unspecified: Secondary | ICD-10-CM | POA: Diagnosis not present

## 2022-04-29 NOTE — Progress Notes (Signed)
?[facility] ?[phone]   [phone] ? ?PROGRESS NOTE ? ?Patient Care Team: ?[name], MD as PCP - General (Family Medicine) ? ?Hematological/Oncological History ?# Iron Deficiency Anemia of Unclear Etiology  ?05/08/2021: WBC 4.5, Hgb 9.7, MCV 64.8, Plt 323, Iron 14, Sat 2.4% ?05/24/2021: Iron Sucrose 500 mg x 2 doses ?06/29/2021: WBC 5.7, Hgb 12.6, MCV 82, Plt 210. Iron 89, Sat 21.3% ?07/24/2021: EGD performed, no evidence of celiac disease or clear source of bleeding. Iron Sat 16%, ferritin 19.9 ?08/20/2021: establish care with Dr. Leonides Schanz  ?01/18/2022-02/18/2022: IV iron sucrose 200 mg x 4 doses. ?04/15/2022: Ferritin <4, Sat ratio 6%, TIBC 490, Hgb 12.0, WBC 6.8, Plt 280  ? ?Interval History:  ?Amanda [name] 41 y.o. female with medical history significant for iron deficiency anemia of unclear etiology who presents for a follow up visit. The patient's last visit was on 01/22/2022. In the interim since the last.  ? ?On exam today Amanda [name] reports no change in her energy levels or health since she received IV iron therapy.  She reports that she felt much better after the infusion stopped.  She notes that her menstrual bleeding has been regular with no recent increase or heavy bleeding.  She also notes that she is having black stools, which she was told was related to IV iron infusions.  She notes that she is also concerned about bouts of tachycardia and palpitations she was having after her last round of IV iron therapy.  She reports that at times her heart rate get up into the 160s.  She also feels "a bounding pulse in her ear".  She also did have nosebleeding in the interim.  Overall she is very concerned that IV iron infusions have not boost her iron levels as concerned about possible alternative etiologies.  She notes that she has not seen any overt signs of blood in her stool and that she is concerned her exercise may be causing hemolysis.  She otherwise denies any fevers,  chills, sweats, nausea, vomiting or diarrhea.  Full 10 point ROS is listed below. ? ?MEDICAL HISTORY:  ?Past Medical History:  ?Diagnosis Date  ? Adhesive capsulitis of shoulder   ? Allergy   ? Childhood asthma   ? Heart murmur   ? History of migraine headaches   ? started age 64, milder with age. some ocular migraines  ? Hypercalcemia   ? noted as cause for kidney stones during pregnancy  ? IDA (iron deficiency anemia)   ? Kidney stones   ? during pregnancy with daughter; under care of [facility]. Calcium Oxalate  ? Pain in right shoulder   ? 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 Shoulder impingement syndrome   ? Stress fracture of femur   ? x 2 per patient  ? Thoracic outlet syndrome   ? Vegan diet   ? ? ?SURGICAL HISTORY: ?Past Surgical History:  ?Procedure Laterality Date  ? CESAREAN SECTION    ? NEPHROSTOMY    ? SHOULDER ARTHROSCOPY Right 12/2012, 07-13-2013  ? URETERAL STENT PLACEMENT    ? ? ?SOCIAL HISTORY: ?Social History  ? ?Socioeconomic History  ? Marital status: Married  ?  Spouse name: Not on file  ? Number of children: Not on file  ? Years of education: Not on file  ? Highest education level: Not on file  ?Occupational History  ? Not on file  ?Tobacco  Use  ? Smoking status: Former  ?  Types: Cigarettes  ?  Quit date: 12/30/2000  ?  Years since quitting: 21.3  ? Smokeless tobacco: Never  ? Tobacco comments:  ?  occasional use during college  ?Vaping Use  ? Vaping Use: Never used  ?Substance and Sexual Activity  ? Alcohol use: Not Currently  ? Drug use: No  ? Sexual activity: Yes  ?  Partners: Male  ?  Comment: husband with vasectomy  ?Other Topics Concern  ? Not on file  ?Social History Narrative  ? Married. 37 yo daughter.  ? 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  ? Run 30 miles/week  ? Vegan diet (not super strict, will eat some baked goods that has eggs).  ? From [place], [place]  ? ?Social  Determinants of Health  ? ?Financial Resource Strain: Not on file  ?Food Insecurity: Not on file  ?Transportation Needs: Not on file  ?Physical Activity: Not on file  ?Stress: Not on file  ?Social Connections: Not on file  ?Intimate Partner Violence: Not on file  ? ? ?FAMILY HISTORY: ?Family History  ?Problem Relation Age of Onset  ? Hyperlipidemia Mother   ? Hypothyroidism Mother   ? Hyperthyroidism Maternal Uncle   ? Hyperthyroidism Maternal Grandmother   ? Heart disease Maternal Grandmother   ?     died 48 from MI  ? Heart disease Maternal Grandfather   ?     CABG   ? Allergies Daughter   ? Cancer Neg Hx   ? Colon cancer Neg Hx   ? Pancreatic cancer Neg Hx   ? Esophageal cancer Neg Hx   ? Colon polyps Neg Hx   ? Rectal cancer Neg Hx   ? Stomach cancer Neg Hx   ? ? ?ALLERGIES:  is allergic to cephalexin, codeine, darvocet a500 [propoxyphene n-acetaminophen], dilaudid [hydromorphone hcl], hydrocodone-acetaminophen, latex, macrobid [nitrofurantoin macrocrystal], morphine sulfate, sulfa antibiotics, and tape. ? ?MEDICATIONS:  ?No current outpatient medications on file.  ? ?No current facility-administered medications for this visit.  ? ? ?REVIEW OF SYSTEMS:   ?Constitutional: ( - ) fevers, ( - )  chills , ( - ) night sweats ?Eyes: ( - ) blurriness of vision, ( - ) double vision, ( - ) watery eyes ?Ears, nose, mouth, throat, and face: ( - ) mucositis, ( - ) sore throat ?Respiratory: ( - ) cough, ( - ) dyspnea, ( - ) wheezes ?Cardiovascular: ( - ) palpitation, ( - ) chest discomfort, ( - ) lower extremity swelling ?Gastrointestinal:  ( - ) nausea, ( - ) heartburn, ( - ) change in bowel habits ?Skin: ( - ) abnormal skin rashes ?Lymphatics: ( - ) new lymphadenopathy, ( - ) easy bruising ?Neurological: ( - ) numbness, ( - ) tingling, ( - ) new weaknesses ?Behavioral/Psych: ( - ) mood change, ( - ) new changes  ?All other systems were reviewed with the patient and are negative. ? ?PHYSICAL EXAMINATION: ? ?Vitals:  ?  04/29/22 1008  ?BP: 116/79  ?Pulse: 64  ?Resp: 15  ?Temp: 97.8 ?F (36.6 ?C)  ?SpO2: 100%  ? ? ?Filed Weights  ? ? ? ?GENERAL: Well-appearing athletic middle-age Caucasian female, alert, no distress and comfortable ?SKIN: skin color, texture, turgor are normal, no rashes or significant lesions ?EYES: conjunctiva are pink and non-injected, sclera clear ?LUNGS: clear to auscultation and percussion with normal breathing effort ?HEART: regular rate & rhythm and no murmurs and no  lower extremity edema ?Musculoskeletal: no cyanosis of digits and no clubbing  ?PSYCH: alert & oriented x 3, fluent speech ?NEURO: no focal motor/sensory deficits ? ?LABORATORY DATA:  ?I have reviewed the data as listed ? ?  Latest Ref Rng & Units 04/15/2022  ? 10:10 AM 01/14/2022  ?  7:53 AM 08/20/2021  ? 11:19 AM  ?CBC  ?WBC 4.0 - 10.5 K/uL 6.8   5.6   6.0    ?Hemoglobin 12.0 - 15.0 g/dL 16.112.0   09.613.8   04.514.3    ?Hematocrit 36.0 - 46.0 % 37.8   41.9   42.8    ?Platelets 150 - 400 K/uL 280   253   205    ? ? ? ?  Latest Ref Rng & Units 04/15/2022  ? 10:10 AM 01/14/2022  ?  7:53 AM 08/20/2021  ? 11:19 AM  ?CMP  ?Glucose 70 - 99 mg/dL 93   409118   94    ?BUN 6 - 20 mg/dL 16   17   18     ?Creatinine 0.44 - 1.00 mg/dL 8.110.68   9.140.65   7.820.78    ?Sodium 135 - 145 mmol/L 141   136   141    ?Potassium 3.5 - 5.1 mmol/L 3.9   3.8   4.7    ?Chloride 98 - 111 mmol/L 104   102   104    ?CO2 22 - 32 mmol/L 30   25   28     ?Calcium 8.9 - 10.3 mg/dL 9.7   9.1   9.9    ?Total Protein 6.5 - 8.1 g/dL 6.9   7.3   7.6    ?Total Bilirubin 0.3 - 1.2 mg/dL 0.3   0.3   0.3    ?Alkaline Phos 38 - 126 U/L 44   48   45    ?AST 15 - 41 U/L 29   38   27    ?ALT 0 - 44 U/L 20   31   30     ? ? ?RADIOGRAPHIC STUDIES: ?No results found. ? ?ASSESSMENT & PLAN ?Amanda [name] 41 y.o. female with medical history significant for iron deficiency anemia of unclear etiology who presents for a follow up visit. ? ?After review of the labs, review of the records, and discussion with the patient the  patients findings are most consistent with iron deficiency anemia secondary to vegan diet and menstrual cycles.  No evidence of GI bleeding on EGD and colonoscopy.  She is currently being scheduled for a capsule endoscopy.

## 2022-05-03 ENCOUNTER — Encounter: Payer: Self-pay | Admitting: Hematology and Oncology

## 2022-05-08 ENCOUNTER — Encounter: Payer: Self-pay | Admitting: Hematology and Oncology

## 2022-05-09 ENCOUNTER — Other Ambulatory Visit: Payer: Self-pay | Admitting: Pharmacy Technician

## 2022-05-09 ENCOUNTER — Telehealth: Payer: Self-pay | Admitting: Pharmacy Technician

## 2022-05-09 NOTE — Telephone Encounter (Signed)
Dr. Leonides Schanz, ? ?Auth Submission: Denied ?Payer: UMR ?Medication & CPT/J Code(s) submitted: monoferric J1437 ?Route of submission (phone, fax, portal): phone: ?[phone] ?Auth type: Buy/Bill ?Units/visits requested:  ?Reference number: [id number] ?Denied due to patient has not tried and or failed step therapy. ?Venofer ?Infed ?Ferrlecit. ? ?Would you like to try venofer?? ?Please advise. ? ?

## 2022-05-12 ENCOUNTER — Encounter: Payer: Self-pay | Admitting: Hematology and Oncology

## 2022-05-13 ENCOUNTER — Telehealth: Payer: Self-pay | Admitting: Pharmacy Technician

## 2022-05-13 ENCOUNTER — Other Ambulatory Visit: Payer: Self-pay | Admitting: Pharmacy Technician

## 2022-05-13 NOTE — Telephone Encounter (Addendum)
Auth Submission: approved Payer: umr - quatam Medication & CPT/J Code(s) submitted: monoferric Route of submission phone:  Auth type: Buy/Bill Units/visits requested: x1 dose Reference number: [id number] Phone: [phone] Approval from: 05/13/22 - 06/12/22  Requested re-review due recent iv infusion of venofer did not bolster iron level.  Berkley Harvey has been approved:  PA: [id number]  Spoke with patient and informed her that the Venofer treatment has been cancelled and she will receive monoferric (originial request)   Monoferric co-pay card:  Approved: 05/20/22 - 12/29/22 Id: [id number] Bin: [id number] Pnc: 54 Gr: [id number] Fax claims: [phone] Phone: [phone] Auto re-enroll every

## 2022-05-16 ENCOUNTER — Ambulatory Visit (INDEPENDENT_AMBULATORY_CARE_PROVIDER_SITE_OTHER): Payer: Commercial Managed Care - PPO

## 2022-05-16 VITALS — BP 112/69 | HR 60 | Temp 98.3°F | Resp 16 | Ht 66.0 in

## 2022-05-16 DIAGNOSIS — D508 Other iron deficiency anemias: Secondary | ICD-10-CM

## 2022-05-16 MED ORDER — SODIUM CHLORIDE 0.9 % IV SOLN
1000.0000 mg | Freq: Once | INTRAVENOUS | Status: AC
  Administered 2022-05-16: 1000 mg via INTRAVENOUS
  Filled 2022-05-16: qty 10

## 2022-05-16 NOTE — Progress Notes (Signed)
Diagnosis: Iron Deficiency Anemia  Provider:  Marshell Garfinkel, MD  Procedure: Infusion  IV Type: Peripheral, IV Location: L Forearm  Monoferric (Ferric Derisomaltose), Dose: 1000 mg  Infusion Start Time: [id number]  Infusion Stop Time: 1211  Post Infusion IV Care: Peripheral IV Discontinued Pt refused observation period  Discharge: Condition: Good, Destination: Home . AVS provided to patient.   Performed by:  Paul Dykes, RN

## 2022-05-21 ENCOUNTER — Encounter: Payer: Self-pay | Admitting: Hematology and Oncology

## 2022-06-20 ENCOUNTER — Encounter: Payer: Self-pay | Admitting: Hematology and Oncology

## 2022-06-23 ENCOUNTER — Other Ambulatory Visit: Payer: Self-pay | Admitting: Hematology and Oncology

## 2022-06-23 DIAGNOSIS — D509 Iron deficiency anemia, unspecified: Secondary | ICD-10-CM

## 2022-06-24 ENCOUNTER — Inpatient Hospital Stay (HOSPITAL_BASED_OUTPATIENT_CLINIC_OR_DEPARTMENT_OTHER): Payer: Commercial Managed Care - PPO | Admitting: Hematology and Oncology

## 2022-06-24 ENCOUNTER — Inpatient Hospital Stay: Payer: Commercial Managed Care - PPO | Attending: Hematology and Oncology

## 2022-06-24 VITALS — BP 117/83 | HR 65 | Temp 97.6°F | Resp 16

## 2022-06-24 DIAGNOSIS — D509 Iron deficiency anemia, unspecified: Secondary | ICD-10-CM

## 2022-06-24 LAB — CMP (CANCER CENTER ONLY)
ALT: 28 U/L (ref 0–44)
AST: 30 U/L (ref 15–41)
Albumin: 4.4 g/dL (ref 3.5–5.0)
Alkaline Phosphatase: 41 U/L (ref 38–126)
Anion gap: 6 (ref 5–15)
BUN: 22 mg/dL — ABNORMAL HIGH (ref 6–20)
CO2: 28 mmol/L (ref 22–32)
Calcium: 9.4 mg/dL (ref 8.9–10.3)
Chloride: 107 mmol/L (ref 98–111)
Creatinine: 0.7 mg/dL (ref 0.44–1.00)
GFR, Estimated: >60 mL/min (ref >60)
Glucose, Bld: 109 mg/dL — ABNORMAL HIGH (ref 70–99)
Potassium: 4 mmol/L (ref 3.5–5.1)
Sodium: 141 mmol/L (ref 135–145)
Total Bilirubin: 0.4 mg/dL (ref 0.3–1.2)
Total Protein: 6.4 g/dL — ABNORMAL LOW (ref 6.5–8.1)

## 2022-06-24 LAB — RETIC PANEL
Immature Retic Fract: 11.8 % (ref 2.3–15.9)
RBC.: 3.78 MIL/uL — ABNORMAL LOW (ref 3.87–5.11)
Retic Count, Absolute: 60.5 10*3/uL (ref 19.0–186.0)
Retic Ct Pct: 1.6 % (ref 0.4–3.1)
Reticulocyte Hemoglobin: 30.7 pg (ref >27.9)

## 2022-06-24 LAB — IRON AND IRON BINDING CAPACITY (CC-WL,HP ONLY)
Iron: 47 ug/dL (ref 28–170)
Saturation Ratios: 14 % (ref 10.4–31.8)
TIBC: 344 ug/dL (ref 250–450)
UIBC: 297 ug/dL (ref 148–442)

## 2022-06-24 LAB — CBC WITH DIFFERENTIAL (CANCER CENTER ONLY)
Abs Immature Granulocytes: 0.01 10*3/uL (ref 0.00–0.07)
Basophils Absolute: 0.1 10*3/uL (ref 0.0–0.1)
Basophils Relative: 1 %
Eosinophils Absolute: 0.1 10*3/uL (ref 0.0–0.5)
Eosinophils Relative: 2 %
HCT: 33.9 % — ABNORMAL LOW (ref 36.0–46.0)
Hemoglobin: 11.2 g/dL — ABNORMAL LOW (ref 12.0–15.0)
Immature Granulocytes: 0 %
Lymphocytes Relative: 20 %
Lymphs Abs: 1.4 10*3/uL (ref 0.7–4.0)
MCH: 29.4 pg (ref 26.0–34.0)
MCHC: 33 g/dL (ref 30.0–36.0)
MCV: 89 fL (ref 80.0–100.0)
Monocytes Absolute: 0.5 10*3/uL (ref 0.1–1.0)
Monocytes Relative: 7 %
Neutro Abs: 4.8 10*3/uL (ref 1.7–7.7)
Neutrophils Relative %: 70 %
Platelet Count: 242 10*3/uL (ref 150–400)
RBC: 3.81 MIL/uL — ABNORMAL LOW (ref 3.87–5.11)
RDW: 20.1 % — ABNORMAL HIGH (ref 11.5–15.5)
WBC Count: 6.9 10*3/uL (ref 4.0–10.5)
nRBC: 0 % (ref 0.0–0.2)

## 2022-06-24 LAB — FERRITIN: Ferritin: 13 ng/mL (ref 11–307)

## 2022-06-25 ENCOUNTER — Telehealth: Payer: Self-pay | Admitting: Hematology and Oncology

## 2022-06-25 ENCOUNTER — Telehealth: Payer: Self-pay | Admitting: Pharmacy Technician

## 2022-06-25 ENCOUNTER — Encounter: Payer: Self-pay | Admitting: Hematology and Oncology

## 2022-06-25 NOTE — Telephone Encounter (Signed)
Per 6/26 los called and spoke to pt about appointment.  Pt stated she would call back if she needed to change.  She said after the infusion center calls her she will call us to make sure appointment is correct

## 2022-06-27 ENCOUNTER — Ambulatory Visit (INDEPENDENT_AMBULATORY_CARE_PROVIDER_SITE_OTHER): Payer: Commercial Managed Care - PPO

## 2022-06-27 VITALS — BP 118/78 | HR 56 | Temp 98.3°F | Resp 16 | Ht 66.0 in

## 2022-06-27 DIAGNOSIS — D508 Other iron deficiency anemias: Secondary | ICD-10-CM

## 2022-06-27 MED ORDER — SODIUM CHLORIDE 0.9 % IV SOLN
1000.0000 mg | Freq: Once | INTRAVENOUS | Status: AC
  Administered 2022-06-27: 1000 mg via INTRAVENOUS
  Filled 2022-06-27: qty 10

## 2022-06-27 NOTE — Progress Notes (Signed)
Diagnosis: Iron Deficiency Anemia  Provider:  Chilton Greathouse, MD  Procedure: Infusion  IV Type: Peripheral, IV Location: R Upper Arm  Monoferric (Ferric Derisomaltose), Dose: 1000 mg  Infusion Start Time: 1034  Infusion Stop Time: 1056  Post Infusion IV Care: Peripheral IV Discontinued  Discharge: Condition: Good, Destination: Home . AVS provided to patient.   Performed by:  Nat Math, RN

## 2022-06-28 ENCOUNTER — Other Ambulatory Visit: Payer: Commercial Managed Care - PPO

## 2022-06-28 ENCOUNTER — Ambulatory Visit: Payer: Commercial Managed Care - PPO | Admitting: Hematology and Oncology

## 2022-07-01 ENCOUNTER — Encounter: Payer: Self-pay | Admitting: Hematology and Oncology

## 2022-08-05 ENCOUNTER — Ambulatory Visit: Payer: Commercial Managed Care - PPO | Admitting: Hematology and Oncology

## 2022-08-05 ENCOUNTER — Other Ambulatory Visit: Payer: Commercial Managed Care - PPO

## 2022-08-07 ENCOUNTER — Other Ambulatory Visit: Payer: Self-pay | Admitting: Hematology and Oncology

## 2022-08-07 DIAGNOSIS — D509 Iron deficiency anemia, unspecified: Secondary | ICD-10-CM

## 2022-08-08 ENCOUNTER — Inpatient Hospital Stay: Payer: Commercial Managed Care - PPO | Admitting: Hematology and Oncology

## 2022-08-08 ENCOUNTER — Inpatient Hospital Stay: Payer: Commercial Managed Care - PPO | Attending: Hematology and Oncology

## 2022-08-08 DIAGNOSIS — D509 Iron deficiency anemia, unspecified: Secondary | ICD-10-CM | POA: Insufficient documentation

## 2022-08-13 ENCOUNTER — Inpatient Hospital Stay: Payer: Commercial Managed Care - PPO

## 2022-08-13 ENCOUNTER — Other Ambulatory Visit: Payer: Self-pay

## 2022-08-13 DIAGNOSIS — D509 Iron deficiency anemia, unspecified: Secondary | ICD-10-CM

## 2022-08-13 LAB — CBC WITH DIFFERENTIAL (CANCER CENTER ONLY)
Abs Immature Granulocytes: 0.02 10*3/uL (ref 0.00–0.07)
Basophils Absolute: 0.1 10*3/uL (ref 0.0–0.1)
Basophils Relative: 1 %
Eosinophils Absolute: 0.1 10*3/uL (ref 0.0–0.5)
Eosinophils Relative: 2 %
HCT: 38.9 % (ref 36.0–46.0)
Hemoglobin: 13.5 g/dL (ref 12.0–15.0)
Immature Granulocytes: 0 %
Lymphocytes Relative: 28 %
Lymphs Abs: 1.9 10*3/uL (ref 0.7–4.0)
MCH: 32 pg (ref 26.0–34.0)
MCHC: 34.7 g/dL (ref 30.0–36.0)
MCV: 92.2 fL (ref 80.0–100.0)
Monocytes Absolute: 0.4 10*3/uL (ref 0.1–1.0)
Monocytes Relative: 6 %
Neutro Abs: 4.2 10*3/uL (ref 1.7–7.7)
Neutrophils Relative %: 63 %
Platelet Count: 224 10*3/uL (ref 150–400)
RBC: 4.22 MIL/uL (ref 3.87–5.11)
RDW: 15 % (ref 11.5–15.5)
WBC Count: 6.7 10*3/uL (ref 4.0–10.5)
nRBC: 0 % (ref 0.0–0.2)

## 2022-08-13 LAB — CMP (CANCER CENTER ONLY)
ALT: 21 U/L (ref 0–44)
AST: 25 U/L (ref 15–41)
Albumin: 4.5 g/dL (ref 3.5–5.0)
Alkaline Phosphatase: 39 U/L (ref 38–126)
Anion gap: 4 — ABNORMAL LOW (ref 5–15)
BUN: 11 mg/dL (ref 6–20)
CO2: 29 mmol/L (ref 22–32)
Calcium: 9.6 mg/dL (ref 8.9–10.3)
Chloride: 106 mmol/L (ref 98–111)
Creatinine: 0.64 mg/dL (ref 0.44–1.00)
GFR, Estimated: >60 mL/min (ref >60)
Glucose, Bld: 89 mg/dL (ref 70–99)
Potassium: 3.6 mmol/L (ref 3.5–5.1)
Sodium: 139 mmol/L (ref 135–145)
Total Bilirubin: 0.3 mg/dL (ref 0.3–1.2)
Total Protein: 6.7 g/dL (ref 6.5–8.1)

## 2022-08-13 LAB — RETIC PANEL
Immature Retic Fract: 3.6 % (ref 2.3–15.9)
RBC.: 4.29 MIL/uL (ref 3.87–5.11)
Retic Count, Absolute: 33.5 10*3/uL (ref 19.0–186.0)
Retic Ct Pct: 0.8 % (ref 0.4–3.1)
Reticulocyte Hemoglobin: 36.9 pg (ref >27.9)

## 2022-08-13 LAB — IRON AND IRON BINDING CAPACITY (CC-WL,HP ONLY)
Iron: 97 ug/dL (ref 28–170)
Saturation Ratios: 36 % — ABNORMAL HIGH (ref 10.4–31.8)
TIBC: 267 ug/dL (ref 250–450)
UIBC: 170 ug/dL (ref 148–442)

## 2022-08-13 LAB — FERRITIN: Ferritin: 74 ng/mL (ref 11–307)

## 2022-08-14 ENCOUNTER — Encounter: Payer: Self-pay | Admitting: Hematology and Oncology

## 2022-08-14 ENCOUNTER — Telehealth: Payer: Self-pay | Admitting: *Deleted

## 2022-08-14 ENCOUNTER — Other Ambulatory Visit: Payer: Self-pay | Admitting: *Deleted

## 2022-08-14 DIAGNOSIS — D509 Iron deficiency anemia, unspecified: Secondary | ICD-10-CM

## 2022-08-14 NOTE — Telephone Encounter (Signed)
[name] patient regarding lab results from yesterday. Spoke with her. Advised that her iron levels have improved significantly.. Pt wants to repeat her labs along with a Vitamin A level in 6 weeks. She does not want to wait 3 months to check her labs. Appt for this has been scheduled for 09/20/22

## 2022-08-14 NOTE — Telephone Encounter (Signed)
-----   Message from Briant Cedar, PA-C sent at 08/14/2022  8:59 AM EDT ----- Please notify patient that labs look good. Anemia has resolved. Iron levels are normal. Please request labs and f/u in 3 months.    ----- Message ----- From: Interface, Lab In [place] Sent: 08/13/2022   2:02 PM EDT To: Jaci Standard, MD

## 2022-08-27 ENCOUNTER — Other Ambulatory Visit: Payer: Commercial Managed Care - PPO

## 2022-08-27 ENCOUNTER — Ambulatory Visit: Payer: Commercial Managed Care - PPO | Admitting: Hematology and Oncology

## 2022-09-04 ENCOUNTER — Encounter: Payer: Self-pay | Admitting: Internal Medicine

## 2022-09-20 ENCOUNTER — Other Ambulatory Visit: Payer: Self-pay | Admitting: Hematology and Oncology

## 2022-09-20 ENCOUNTER — Encounter: Payer: Self-pay | Admitting: Hematology and Oncology

## 2022-09-20 ENCOUNTER — Inpatient Hospital Stay: Payer: Commercial Managed Care - PPO | Attending: Hematology and Oncology

## 2022-09-20 DIAGNOSIS — D509 Iron deficiency anemia, unspecified: Secondary | ICD-10-CM

## 2022-09-20 LAB — CBC WITH DIFFERENTIAL (CANCER CENTER ONLY)
Abs Immature Granulocytes: 0.01 10*3/uL (ref 0.00–0.07)
Basophils Absolute: 0 10*3/uL (ref 0.0–0.1)
Basophils Relative: 1 %
Eosinophils Absolute: 0.1 10*3/uL (ref 0.0–0.5)
Eosinophils Relative: 3 %
HCT: 44.1 % (ref 36.0–46.0)
Hemoglobin: 15 g/dL (ref 12.0–15.0)
Immature Granulocytes: 0 %
Lymphocytes Relative: 26 %
Lymphs Abs: 1.2 10*3/uL (ref 0.7–4.0)
MCH: 31.8 pg (ref 26.0–34.0)
MCHC: 34 g/dL (ref 30.0–36.0)
MCV: 93.4 fL (ref 80.0–100.0)
Monocytes Absolute: 0.4 10*3/uL (ref 0.1–1.0)
Monocytes Relative: 8 %
Neutro Abs: 2.9 10*3/uL (ref 1.7–7.7)
Neutrophils Relative %: 62 %
Platelet Count: 189 10*3/uL (ref 150–400)
RBC: 4.72 MIL/uL (ref 3.87–5.11)
RDW: 13.2 % (ref 11.5–15.5)
WBC Count: 4.6 10*3/uL (ref 4.0–10.5)
nRBC: 0 % (ref 0.0–0.2)

## 2022-09-20 LAB — CMP (CANCER CENTER ONLY)
ALT: 30 U/L (ref 0–44)
AST: 35 U/L (ref 15–41)
Albumin: 5 g/dL (ref 3.5–5.0)
Alkaline Phosphatase: 50 U/L (ref 38–126)
Anion gap: 5 (ref 5–15)
BUN: 16 mg/dL (ref 6–20)
CO2: 29 mmol/L (ref 22–32)
Calcium: 9.8 mg/dL (ref 8.9–10.3)
Chloride: 106 mmol/L (ref 98–111)
Creatinine: 0.66 mg/dL (ref 0.44–1.00)
GFR, Estimated: >60 mL/min (ref >60)
Glucose, Bld: 96 mg/dL (ref 70–99)
Potassium: 3.9 mmol/L (ref 3.5–5.1)
Sodium: 140 mmol/L (ref 135–145)
Total Bilirubin: 0.4 mg/dL (ref 0.3–1.2)
Total Protein: 7.2 g/dL (ref 6.5–8.1)

## 2022-09-20 LAB — FERRITIN: Ferritin: 65 ng/mL (ref 11–307)

## 2022-09-20 LAB — IRON AND IRON BINDING CAPACITY (CC-WL,HP ONLY)
Iron: 94 ug/dL (ref 28–170)
Saturation Ratios: 29 % (ref 10.4–31.8)
TIBC: 325 ug/dL (ref 250–450)
UIBC: 231 ug/dL (ref 148–442)

## 2022-09-20 LAB — RETIC PANEL
Immature Retic Fract: 8.5 % (ref 2.3–15.9)
RBC.: 4.67 MIL/uL (ref 3.87–5.11)
Retic Count, Absolute: 30.4 10*3/uL (ref 19.0–186.0)
Retic Ct Pct: 0.7 % (ref 0.4–3.1)
Reticulocyte Hemoglobin: 34.1 pg (ref >27.9)

## 2022-09-24 LAB — VITAMIN A: Vitamin A (Retinoic Acid): 54.3 ug/dL (ref 20.1–62.0)

## 2022-09-25 ENCOUNTER — Telehealth: Payer: Self-pay | Admitting: *Deleted

## 2022-09-25 NOTE — Telephone Encounter (Signed)
[name] patient regarding recent lab results. Spoke to her and advised that her Ferritin is above 50 and her HGB has normalized. Pt voiced understanding. No new symptoms other than occasional pounding in her ears. Otherwise she feels well. She is aware of her appts in mid-November.

## 2022-09-25 NOTE — Telephone Encounter (Signed)
-----   Message from Orson Slick, MD sent at 09/25/2022 12:05 PM EDT ----- Please let Amanda [name] know that her labs look excellent.  Her ferritin is currently above 50 and her hemoglobin has completely normalized.  Please ask her if she is having any new symptoms or if she has made any lifestyle changes to help maintain her iron and hemoglobin levels.  ----- Message ----- From: Buel Ream, Lab In [place] Sent: 09/20/2022   8:10 AM EDT To: Orson Slick, MD

## 2022-10-16 ENCOUNTER — Encounter: Payer: Self-pay | Admitting: Hematology and Oncology

## 2022-11-14 ENCOUNTER — Inpatient Hospital Stay: Payer: Commercial Managed Care - PPO

## 2022-11-14 ENCOUNTER — Telehealth: Payer: Self-pay | Admitting: *Deleted

## 2022-11-14 ENCOUNTER — Inpatient Hospital Stay: Payer: Commercial Managed Care - PPO | Attending: Hematology and Oncology | Admitting: Hematology and Oncology

## 2022-11-14 VITALS — BP 125/74 | HR 70 | Temp 98.3°F | Resp 14

## 2022-11-14 DIAGNOSIS — D509 Iron deficiency anemia, unspecified: Secondary | ICD-10-CM | POA: Diagnosis not present

## 2022-11-14 DIAGNOSIS — Z87891 Personal history of nicotine dependence: Secondary | ICD-10-CM | POA: Insufficient documentation

## 2022-11-14 LAB — CBC WITH DIFFERENTIAL (CANCER CENTER ONLY)
Abs Immature Granulocytes: 0.01 10*3/uL (ref 0.00–0.07)
Basophils Absolute: 0 10*3/uL (ref 0.0–0.1)
Basophils Relative: 1 %
Eosinophils Absolute: 0.1 10*3/uL (ref 0.0–0.5)
Eosinophils Relative: 3 %
HCT: 40.9 % (ref 36.0–46.0)
Hemoglobin: 13.6 g/dL (ref 12.0–15.0)
Immature Granulocytes: 0 %
Lymphocytes Relative: 20 %
Lymphs Abs: 1.1 10*3/uL (ref 0.7–4.0)
MCH: 32.5 pg (ref 26.0–34.0)
MCHC: 33.3 g/dL (ref 30.0–36.0)
MCV: 97.8 fL (ref 80.0–100.0)
Monocytes Absolute: 0.5 10*3/uL (ref 0.1–1.0)
Monocytes Relative: 9 %
Neutro Abs: 3.6 10*3/uL (ref 1.7–7.7)
Neutrophils Relative %: 67 %
Platelet Count: 224 10*3/uL (ref 150–400)
RBC: 4.18 MIL/uL (ref 3.87–5.11)
RDW: 13 % (ref 11.5–15.5)
WBC Count: 5.3 10*3/uL (ref 4.0–10.5)
nRBC: 0 % (ref 0.0–0.2)

## 2022-11-14 LAB — IRON AND IRON BINDING CAPACITY (CC-WL,HP ONLY)
Iron: 161 ug/dL (ref 28–170)
Saturation Ratios: 46 % — ABNORMAL HIGH (ref 10.4–31.8)
TIBC: 349 ug/dL (ref 250–450)
UIBC: 188 ug/dL (ref 148–442)

## 2022-11-14 LAB — RETIC PANEL
Immature Retic Fract: 8.5 % (ref 2.3–15.9)
RBC.: 4.21 MIL/uL (ref 3.87–5.11)
Retic Count, Absolute: 61 10*3/uL (ref 19.0–186.0)
Retic Ct Pct: 1.5 % (ref 0.4–3.1)
Reticulocyte Hemoglobin: 35.2 pg (ref >27.9)

## 2022-11-14 LAB — CMP (CANCER CENTER ONLY)
ALT: 18 U/L (ref 0–44)
AST: 21 U/L (ref 15–41)
Albumin: 5.2 g/dL — ABNORMAL HIGH (ref 3.5–5.0)
Alkaline Phosphatase: 55 U/L (ref 38–126)
Anion gap: 6 (ref 5–15)
BUN: 14 mg/dL (ref 6–20)
CO2: 29 mmol/L (ref 22–32)
Calcium: 10.1 mg/dL (ref 8.9–10.3)
Chloride: 105 mmol/L (ref 98–111)
Creatinine: 0.67 mg/dL (ref 0.44–1.00)
GFR, Estimated: >60 mL/min (ref >60)
Glucose, Bld: 98 mg/dL (ref 70–99)
Potassium: 4.2 mmol/L (ref 3.5–5.1)
Sodium: 140 mmol/L (ref 135–145)
Total Bilirubin: 0.9 mg/dL (ref 0.3–1.2)
Total Protein: 7.8 g/dL (ref 6.5–8.1)

## 2022-11-14 LAB — FERRITIN: Ferritin: 34 ng/mL (ref 11–307)

## 2022-11-14 NOTE — Progress Notes (Signed)
[facility] [phone]   [phone]  PROGRESS NOTE  Patient Care Team: [name], MD as PCP - General (Family Medicine)  Hematological/Oncological History # Iron Deficiency Anemia of Unclear Etiology  05/08/2021: WBC 4.5, Hgb 9.7, MCV 64.8, Plt 323, Iron 14, Sat 2.4% 05/24/2021: Iron Sucrose 500 mg x 2 doses 06/29/2021: WBC 5.7, Hgb 12.6, MCV 82, Plt 210. Iron 89, Sat 21.3% 07/24/2021: EGD performed, no evidence of celiac disease or clear source of bleeding. Iron Sat 16%, ferritin 19.9 08/20/2021: establish care with Dr. Leonides Schanz  01/18/2022-02/18/2022: IV iron sucrose 200 mg x 4 doses. 04/15/2022: Ferritin <4, Sat ratio 6%, TIBC 490, Hgb 12.0, WBC 6.8, Plt 280  05/16/2022: IV monoferric 1000 mg x 1 dose.  06/24/2022: White blood cell 6.9, hemoglobin 11.2, MCV 89, and platelets of 242.  Additional labs show saturation 14% total iron binding capacity 344 06/27/2022: IV monoferric 1000 mg x 1 dose.  11/14/2022: WBC 5.3, Hgb 13.6, MCV 97.8, Plt 224, Sat 46%  Interval History:  Amanda [name] 41 y.o. female with medical history significant for iron deficiency anemia of unclear etiology who presents for a follow up visit. The patient's last visit was on 06/24/2022. In the interim since the last she received IV Monoferric on 06/27/2022 with a strong improvement in her Hgb and iron levels.   On exam today [name] reports she is currently hearing her heartbeat in her ears and thinks this is a sign of iron deficiency.  She tends to hear this when her iron levels are low.  She notes that she feels much better than she did when her iron levels were less than 4.  She reports her energy levels are "okay".  She is not able to put a number on it.  She reports that she is not having any lightheadedness or dizziness.  She reports that she would like to have her iron checked every 6 weeks so that we can get ahead of any iron deficiency as it develops.  She reports she is taking  vitamin D supplementation and is currently training for a marathon in [place].  Due to her inability to pinpoint a source of her iron deficiency she would like to be evaluated with a second opinion at [facility].  She otherwise denies any fevers, chills, sweats, nausea, vomiting or diarrhea.  Full 10 point ROS is listed below.  MEDICAL HISTORY:  Past Medical History:  Diagnosis Date   Adhesive capsulitis of shoulder    Allergy    Childhood asthma    Heart murmur    History of migraine headaches    started age 89, milder with age. some ocular migraines   Hypercalcemia    noted as cause for kidney stones during pregnancy   IDA (iron deficiency anemia)    Kidney stones    during pregnancy with daughter; under care of [facility]. Calcium Oxalate   Pain in right shoulder    Placental abruption    chronic, with pregnancy; delivered at 24wk 6 d, lived 6 days, emergency c-section   Shoulder impingement syndrome    Stress fracture of femur    x 2 per patient   Thoracic outlet syndrome    Vegan diet     SURGICAL HISTORY: Past Surgical History:  Procedure Laterality Date   CESAREAN SECTION     NEPHROSTOMY     SHOULDER ARTHROSCOPY Right 12/2012, 07-13-2013   URETERAL STENT PLACEMENT      SOCIAL HISTORY: Social  History   Socioeconomic History   Marital status: Married    Spouse name: Not on file   Number of children: Not on file   Years of education: Not on file   Highest education level: Not on file  Occupational History   Not on file  Tobacco Use   Smoking status: Former    Types: Cigarettes    Quit date: 12/30/2000    Years since quitting: 21.8   Smokeless tobacco: Never   Tobacco comments:    occasional use during college  Vaping Use   Vaping Use: Never used  Substance and Sexual Activity   Alcohol use: Not Currently   Drug use: No   Sexual activity: Yes    Partners: Male    Comment: husband with vasectomy  Other Topics Concern   Not on file   Social History Narrative   Married. 79 yo daughter.   Runs a farm, horse-boarding.   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   Run 30 miles/week   Vegan diet (not super strict, will eat some baked goods that has eggs).   From [place], [place]   Social Determinants of Health   Financial Resource Strain: Not on file  Food Insecurity: Not on file  Transportation Needs: Not on file  Physical Activity: Not on file  Stress: Not on file  Social Connections: Not on file  Intimate Partner Violence: Not on file    FAMILY HISTORY: Family History  Problem Relation Age of Onset   Hyperlipidemia Mother    Hypothyroidism Mother    Hyperthyroidism Maternal Uncle    Hyperthyroidism Maternal Grandmother    Heart disease Maternal Grandmother        died 69 from MI   Heart disease Maternal Grandfather        CABG    Allergies Daughter    Cancer Neg Hx    Colon cancer Neg Hx    Pancreatic cancer Neg Hx    Esophageal cancer Neg Hx    Colon polyps Neg Hx    Rectal cancer Neg Hx    Stomach cancer Neg Hx     ALLERGIES:  is allergic to cephalexin, codeine, darvocet a500 [propoxyphene n-acetaminophen], dilaudid [hydromorphone hcl], hydrocodone-acetaminophen, latex, macrobid [nitrofurantoin macrocrystal], morphine sulfate, sulfa antibiotics, and tape.  MEDICATIONS:  No current outpatient medications on file.   No current facility-administered medications for this visit.    REVIEW OF SYSTEMS:   Constitutional: ( - ) fevers, ( - )  chills , ( - ) night sweats Eyes: ( - ) blurriness of vision, ( - ) double vision, ( - ) watery eyes Ears, nose, mouth, throat, and face: ( - ) mucositis, ( - ) sore throat Respiratory: ( - ) cough, ( - ) dyspnea, ( - ) wheezes Cardiovascular: ( - ) palpitation, ( - ) chest discomfort, ( - ) lower extremity swelling Gastrointestinal:  ( - ) nausea, ( - ) heartburn, ( - ) change in bowel habits Skin: ( - ) abnormal skin rashes Lymphatics: ( - ) new lymphadenopathy, (  - ) easy bruising Neurological: ( - ) numbness, ( - ) tingling, ( - ) new weaknesses Behavioral/Psych: ( - ) mood change, ( - ) new changes  All other systems were reviewed with the patient and are negative.  PHYSICAL EXAMINATION:  Vitals:   11/14/22 0818  BP: 125/74  Pulse: 70  Resp: 14  Temp: 98.3 F (36.8 C)  SpO2: 100%      Filed Weights  GENERAL: Well-appearing athletic middle-age Caucasian female, alert, no distress and comfortable SKIN: skin color, texture, turgor are normal, no rashes or significant lesions EYES: conjunctiva are pink and non-injected, sclera clear LUNGS: clear to auscultation and percussion with normal breathing effort HEART: regular rate & rhythm and no murmurs and no lower extremity edema Musculoskeletal: no cyanosis of digits and no clubbing  PSYCH: alert & oriented x 3, fluent speech NEURO: no focal motor/sensory deficits  LABORATORY DATA:  I have reviewed the data as listed    Latest Ref Rng & Units 11/14/2022    7:55 AM 09/20/2022    7:52 AM 08/13/2022    1:41 PM  CBC  WBC 4.0 - 10.5 K/uL 5.3  4.6  6.7   Hemoglobin 12.0 - 15.0 g/dL 16.1  09.6  04.5   Hematocrit 36.0 - 46.0 % 40.9  44.1  38.9   Platelets 150 - 400 K/uL 224  189  224        Latest Ref Rng & Units 11/14/2022    7:55 AM 09/20/2022    7:52 AM 08/13/2022    1:41 PM  CMP  Glucose 70 - 99 mg/dL 98  96  89   BUN 6 - 20 mg/dL 14  16  11    Creatinine 0.44 - 1.00 mg/dL  4.09  8.11   Sodium 135 - 145 mmol/L 140  140  139   Potassium 3.5 - 5.1 mmol/L 4.2  3.9  3.6   Chloride 98 - 111 mmol/L 105  106  106   CO2 22 - 32 mmol/L 29  29  29    Calcium 8.9 - 10.3 mg/dL 9.14  9.8  9.6   Total Protein 6.5 - 8.1 g/dL 7.8  7.2  6.7   Total Bilirubin 0.3 - 1.2 mg/dL 0.9  0.4  0.3   Alkaline Phos 38 - 126 U/L 55  50  39   AST 15 - 41 U/L 21  35  25   ALT 0 - 44 U/L 18  30  21      RADIOGRAPHIC STUDIES: No results found.  ASSESSMENT & PLAN CLEVELAND PAIZ 41 y.o. female  with medical history significant for iron deficiency anemia of unclear etiology who presents for a follow up visit.  After review of the labs, review of the records, and discussion with the patient the patients findings are most consistent with iron deficiency anemia secondary to vegan diet and menstrual cycles.  Despite numerous IV iron infusions the patient's iron deficiency persists in the above etiologies do not adequately explain her continued low levels of ferritin.  No evidence of GI bleeding on EGD, capsule endoscopy and colonoscopy.   She has tried p.o. iron therapy before in the past with unfortunate GI upset.  She responded well to her IV iron sucrose 500 mg x 2 doses, but required additional 2 doses of IV Monoferric with the last one being in June 2023.  Her hemoglobin and iron levels have stayed elevated since that time.  # Iron Deficiency Anemia of Unclear Etiology -- no evidence of GI Bleeding on EGD or colonoscopy. Additionally she had a negative capsule endoscopy --patient not able to tolerate PO iron therapy due to GI upset --Patient is a vegan and does not have particularly heavy menstrual cycles.  She does not believe these to be the etiology of her iron deficiency. --Because we are unable to find a clear etiology for the patient's iron deficiency the patient has requested a referral  to [facility] --patient responded well to IV iron sucrose 500mg  x 2 doses with robust boost in Hgb, but her ferritin levels remained <30.  --last IV iron treatment was IV Monoferric on 06/27/2022. Ferritin improved to 65 at last check on 09/20/2022. -- labs today show white blood cell 5.3, hemoglobin 13.6, MCV 97.8, and platelets of 224. --Encouraged iron rich foods in her diet.  --Return to clinic in 6 months time with interval 6 week lab checks (per patient request)  No orders of the defined types were placed in this encounter.   All questions were answered.  The patient knows to call the clinic with any problems, questions or concerns.  A total of more than 25 minutes were spent on this encounter with face-to-face time and non-face-to-face time, including preparing to see the patient, ordering tests and/or medications, counseling the patient and coordination of care as outlined above.   Ulysees BarnsJohn T. Kailani Brass, MD Department of Hematology/Oncology [facility] Phone: [phone] Pager: [phone] Email: [email or web address]  11/14/2022 9:16 AM

## 2022-11-14 NOTE — Telephone Encounter (Signed)
Referral to [facility] was fax'd to [phone], Pt requested 2nd opinion

## 2022-12-19 ENCOUNTER — Inpatient Hospital Stay: Payer: Commercial Managed Care - PPO | Attending: Hematology and Oncology

## 2022-12-19 ENCOUNTER — Encounter: Payer: Self-pay | Admitting: Hematology and Oncology

## 2022-12-19 ENCOUNTER — Other Ambulatory Visit: Payer: Self-pay | Admitting: Hematology and Oncology

## 2022-12-19 DIAGNOSIS — D509 Iron deficiency anemia, unspecified: Secondary | ICD-10-CM | POA: Insufficient documentation

## 2022-12-19 LAB — CBC WITH DIFFERENTIAL (CANCER CENTER ONLY)
Abs Immature Granulocytes: 0 10*3/uL (ref 0.00–0.07)
Basophils Absolute: 0.1 10*3/uL (ref 0.0–0.1)
Basophils Relative: 1 %
Eosinophils Absolute: 0.2 10*3/uL (ref 0.0–0.5)
Eosinophils Relative: 4 %
HCT: 37.5 % (ref 36.0–46.0)
Hemoglobin: 12.5 g/dL (ref 12.0–15.0)
Immature Granulocytes: 0 %
Lymphocytes Relative: 25 %
Lymphs Abs: 1.2 10*3/uL (ref 0.7–4.0)
MCH: 32.3 pg (ref 26.0–34.0)
MCHC: 33.3 g/dL (ref 30.0–36.0)
MCV: 96.9 fL (ref 80.0–100.0)
Monocytes Absolute: 0.3 10*3/uL (ref 0.1–1.0)
Monocytes Relative: 7 %
Neutro Abs: 2.9 10*3/uL (ref 1.7–7.7)
Neutrophils Relative %: 63 %
Platelet Count: 227 10*3/uL (ref 150–400)
RBC: 3.87 MIL/uL (ref 3.87–5.11)
RDW: 12 % (ref 11.5–15.5)
WBC Count: 4.6 10*3/uL (ref 4.0–10.5)
nRBC: 0 % (ref 0.0–0.2)

## 2022-12-19 LAB — FERRITIN: Ferritin: 7 ng/mL — ABNORMAL LOW (ref 11–307)

## 2022-12-24 ENCOUNTER — Telehealth: Payer: Self-pay | Admitting: Pharmacy Technician

## 2022-12-24 NOTE — Telephone Encounter (Signed)
Dr. Leonides Schanz, Yes, we will try to get her in by Friday.

## 2022-12-24 NOTE — Telephone Encounter (Signed)
Auth Submission: APPROVED Payer: UMR Medication & CPT/J Code(s) submitted: Monoferric (Ferrci derisomaltose) [phone] Route of submission (phone, fax, portal):  Phone # [phone] Fax # Auth type: Buy/Bill Units/visits requested: 1 Reference number: [id number] Approval from: 12/24/22 to 06/21/23   Note: Berkley Harvey has been approved for x 2 dose if patient needs 2nd dose prior to 06/21/23

## 2022-12-25 ENCOUNTER — Ambulatory Visit (INDEPENDENT_AMBULATORY_CARE_PROVIDER_SITE_OTHER): Payer: Commercial Managed Care - PPO

## 2022-12-25 VITALS — BP 101/65 | HR 63 | Temp 98.2°F | Resp 16

## 2022-12-25 DIAGNOSIS — D508 Other iron deficiency anemias: Secondary | ICD-10-CM

## 2022-12-25 MED ORDER — SODIUM CHLORIDE 0.9 % IV SOLN
1000.0000 mg | Freq: Once | INTRAVENOUS | Status: AC
  Administered 2022-12-25: 1000 mg via INTRAVENOUS
  Filled 2022-12-25: qty 10

## 2022-12-25 NOTE — Progress Notes (Signed)
Diagnosis: Iron Deficiency Anemia  Provider:  Chilton Greathouse MD  Procedure: Infusion  IV Type: Peripheral, IV Location: R Forearm  Monoferric (Ferric Derisomaltose), Dose: 1000 mg  Infusion Start Time: 1137  Infusion Stop Time: 1208  Post Infusion IV Care: Peripheral IV Discontinued  Discharge: Condition: Good, Destination: Home . AVS provided to patient.   Performed by:  Loney Hering, LPN

## 2023-01-30 ENCOUNTER — Encounter: Payer: Self-pay | Admitting: Hematology and Oncology

## 2023-02-04 ENCOUNTER — Encounter: Payer: Self-pay | Admitting: Hematology and Oncology

## 2023-02-06 ENCOUNTER — Encounter: Payer: Self-pay | Admitting: Hematology and Oncology

## 2023-02-06 ENCOUNTER — Other Ambulatory Visit: Payer: Self-pay | Admitting: Hematology and Oncology

## 2023-02-06 ENCOUNTER — Inpatient Hospital Stay: Payer: 59 | Attending: Hematology and Oncology

## 2023-02-06 DIAGNOSIS — D509 Iron deficiency anemia, unspecified: Secondary | ICD-10-CM | POA: Insufficient documentation

## 2023-02-06 LAB — CBC WITH DIFFERENTIAL (CANCER CENTER ONLY)
Abs Immature Granulocytes: 0.01 10*3/uL (ref 0.00–0.07)
Basophils Absolute: 0.1 10*3/uL (ref 0.0–0.1)
Basophils Relative: 1 %
Eosinophils Absolute: 0.1 10*3/uL (ref 0.0–0.5)
Eosinophils Relative: 1 %
HCT: 37.8 % (ref 36.0–46.0)
Hemoglobin: 12.2 g/dL (ref 12.0–15.0)
Immature Granulocytes: 0 %
Lymphocytes Relative: 14 %
Lymphs Abs: 1 10*3/uL (ref 0.7–4.0)
MCH: 31.2 pg (ref 26.0–34.0)
MCHC: 32.3 g/dL (ref 30.0–36.0)
MCV: 96.7 fL (ref 80.0–100.0)
Monocytes Absolute: 0.5 10*3/uL (ref 0.1–1.0)
Monocytes Relative: 7 %
Neutro Abs: 5.4 10*3/uL (ref 1.7–7.7)
Neutrophils Relative %: 77 %
Platelet Count: 266 10*3/uL (ref 150–400)
RBC: 3.91 MIL/uL (ref 3.87–5.11)
RDW: 13.2 % (ref 11.5–15.5)
WBC Count: 7 10*3/uL (ref 4.0–10.5)
nRBC: 0 % (ref 0.0–0.2)

## 2023-02-06 LAB — FERRITIN: Ferritin: 23 ng/mL (ref 11–307)

## 2023-02-10 ENCOUNTER — Telehealth: Payer: Self-pay | Admitting: Pharmacy Technician

## 2023-02-10 NOTE — Telephone Encounter (Signed)
Dr. Lorenso Courier, Juluis Rainier note:  Auth Submission: APPROVED Submitted: urgent request Payer: [employer] Medication & CPT/J Code(s) submitted: Monoferric (Ferrci derisomaltose) [phone] Route of submission (phone, fax, portal):  Phone # Fax # Auth type: Buy/Bill Units/visits requested: 1 Reference number: [id number] Approval from: 02/10/23  to 05/11/23

## 2023-02-12 ENCOUNTER — Encounter: Payer: Self-pay | Admitting: Hematology and Oncology

## 2023-02-12 ENCOUNTER — Telehealth: Payer: Self-pay | Admitting: Hematology and Oncology

## 2023-02-12 NOTE — Telephone Encounter (Signed)
Called patient to reschedule May appointment due to provider PAL. Patient rescheduled and notified.

## 2023-02-13 ENCOUNTER — Ambulatory Visit (INDEPENDENT_AMBULATORY_CARE_PROVIDER_SITE_OTHER): Payer: 59

## 2023-02-13 VITALS — BP 101/69 | HR 64 | Temp 97.8°F | Resp 16

## 2023-02-13 DIAGNOSIS — D509 Iron deficiency anemia, unspecified: Secondary | ICD-10-CM | POA: Diagnosis not present

## 2023-02-13 MED ORDER — SODIUM CHLORIDE 0.9 % IV SOLN
1000.0000 mg | Freq: Once | INTRAVENOUS | Status: AC
  Administered 2023-02-13: 1000 mg via INTRAVENOUS
  Filled 2023-02-13: qty 10

## 2023-02-13 NOTE — Progress Notes (Signed)
Diagnosis: Iron Deficiency Anemia  Provider:  Marshell Garfinkel MD  Procedure: Infusion  IV Type: Peripheral, IV Location: R Forearm  Monoferric (Ferric Derisomaltose), Dose: 1000 mg  Infusion Start Time: [id number]  Infusion Stop Time: [id number]  Post Infusion IV Care: Peripheral IV Discontinued  Discharge: Condition: Good, Destination: Home . AVS Declined  Performed by:  Adelina Mings, LPN

## 2023-02-14 ENCOUNTER — Ambulatory Visit: Payer: 59

## 2023-03-20 ENCOUNTER — Encounter: Payer: Self-pay | Admitting: Hematology and Oncology

## 2023-03-21 ENCOUNTER — Other Ambulatory Visit: Payer: Self-pay | Admitting: Hematology and Oncology

## 2023-03-21 DIAGNOSIS — D509 Iron deficiency anemia, unspecified: Secondary | ICD-10-CM

## 2023-03-31 ENCOUNTER — Inpatient Hospital Stay: Payer: 59 | Attending: Hematology and Oncology

## 2023-03-31 DIAGNOSIS — D509 Iron deficiency anemia, unspecified: Secondary | ICD-10-CM | POA: Diagnosis present

## 2023-03-31 LAB — CMP (CANCER CENTER ONLY)
ALT: 33 U/L (ref 0–44)
AST: 38 U/L (ref 15–41)
Albumin: 5.3 g/dL — ABNORMAL HIGH (ref 3.5–5.0)
Alkaline Phosphatase: 66 U/L (ref 38–126)
Anion gap: 8 (ref 5–15)
BUN: 16 mg/dL (ref 6–20)
CO2: 29 mmol/L (ref 22–32)
Calcium: 10.5 mg/dL — ABNORMAL HIGH (ref 8.9–10.3)
Chloride: 104 mmol/L (ref 98–111)
Creatinine: 0.77 mg/dL (ref 0.44–1.00)
GFR, Estimated: >60 mL/min (ref >60)
Glucose, Bld: 88 mg/dL (ref 70–99)
Potassium: 3.7 mmol/L (ref 3.5–5.1)
Sodium: 141 mmol/L (ref 135–145)
Total Bilirubin: 0.5 mg/dL (ref 0.3–1.2)
Total Protein: 8.1 g/dL (ref 6.5–8.1)

## 2023-03-31 LAB — CBC WITH DIFFERENTIAL (CANCER CENTER ONLY)
Abs Immature Granulocytes: 0.01 10*3/uL (ref 0.00–0.07)
Basophils Absolute: 0.1 10*3/uL (ref 0.0–0.1)
Basophils Relative: 1 %
Eosinophils Absolute: 0.1 10*3/uL (ref 0.0–0.5)
Eosinophils Relative: 2 %
HCT: 45 % (ref 36.0–46.0)
Hemoglobin: 15.4 g/dL — ABNORMAL HIGH (ref 12.0–15.0)
Immature Granulocytes: 0 %
Lymphocytes Relative: 30 %
Lymphs Abs: 1.5 10*3/uL (ref 0.7–4.0)
MCH: 31.5 pg (ref 26.0–34.0)
MCHC: 34.2 g/dL (ref 30.0–36.0)
MCV: 92 fL (ref 80.0–100.0)
Monocytes Absolute: 0.4 10*3/uL (ref 0.1–1.0)
Monocytes Relative: 7 %
Neutro Abs: 2.9 10*3/uL (ref 1.7–7.7)
Neutrophils Relative %: 60 %
Platelet Count: 214 10*3/uL (ref 150–400)
RBC: 4.89 MIL/uL (ref 3.87–5.11)
RDW: 13.3 % (ref 11.5–15.5)
WBC Count: 4.9 10*3/uL (ref 4.0–10.5)
nRBC: 0 % (ref 0.0–0.2)

## 2023-03-31 LAB — LACTATE DEHYDROGENASE: LDH: 111 U/L (ref 98–192)

## 2023-03-31 LAB — IRON AND IRON BINDING CAPACITY (CC-WL,HP ONLY)
Iron: 106 ug/dL (ref 28–170)
Saturation Ratios: 34 % — ABNORMAL HIGH (ref 10.4–31.8)
TIBC: 309 ug/dL (ref 250–450)
UIBC: 203 ug/dL (ref 148–442)

## 2023-03-31 LAB — FOLATE: Folate: 19.2 ng/mL (ref >5.9)

## 2023-03-31 LAB — VITAMIN B12: Vitamin B-12: 365 pg/mL (ref 180–914)

## 2023-03-31 LAB — MAGNESIUM: Magnesium: 2.3 mg/dL (ref 1.7–2.4)

## 2023-03-31 LAB — FERRITIN: Ferritin: 129 ng/mL (ref 11–307)

## 2023-04-02 LAB — ZINC: Zinc: 103 ug/dL (ref 44–115)

## 2023-04-03 ENCOUNTER — Telehealth: Payer: Self-pay | Admitting: *Deleted

## 2023-04-03 NOTE — Telephone Encounter (Signed)
LM with note below.  To call if any questions 

## 2023-04-03 NOTE — Telephone Encounter (Signed)
-----   Message from Orson Slick, MD sent at 04/03/2023  9:02 AM EDT ----- Please let Mrs. Dononvan know that her blood work looked excellent.  Her hemoglobin was 15.4 and her iron levels were strong, with a ferritin well above 100.  We also collected the other labs that she had requested with no abnormalities noted.  We will plan to see her back in May as scheduled. ----- Message ----- From: Buel Ream, Lab In [place] Sent: 03/31/2023   7:44 AM EDT To: Orson Slick, MD

## 2023-05-02 ENCOUNTER — Other Ambulatory Visit: Payer: Self-pay | Admitting: *Deleted

## 2023-05-02 DIAGNOSIS — D509 Iron deficiency anemia, unspecified: Secondary | ICD-10-CM

## 2023-05-05 ENCOUNTER — Inpatient Hospital Stay: Payer: 59 | Attending: Hematology and Oncology

## 2023-05-05 ENCOUNTER — Inpatient Hospital Stay: Payer: 59 | Admitting: Hematology and Oncology

## 2023-05-05 DIAGNOSIS — D509 Iron deficiency anemia, unspecified: Secondary | ICD-10-CM | POA: Diagnosis present

## 2023-05-05 LAB — CBC WITH DIFFERENTIAL (CANCER CENTER ONLY)
Abs Immature Granulocytes: 0.01 10*3/uL (ref 0.00–0.07)
Basophils Absolute: 0 10*3/uL (ref 0.0–0.1)
Basophils Relative: 1 %
Eosinophils Absolute: 0.1 10*3/uL (ref 0.0–0.5)
Eosinophils Relative: 2 %
HCT: 41.7 % (ref 36.0–46.0)
Hemoglobin: 14 g/dL (ref 12.0–15.0)
Immature Granulocytes: 0 %
Lymphocytes Relative: 21 %
Lymphs Abs: 1.1 10*3/uL (ref 0.7–4.0)
MCH: 31 pg (ref 26.0–34.0)
MCHC: 33.6 g/dL (ref 30.0–36.0)
MCV: 92.5 fL (ref 80.0–100.0)
Monocytes Absolute: 0.4 10*3/uL (ref 0.1–1.0)
Monocytes Relative: 7 %
Neutro Abs: 3.7 10*3/uL (ref 1.7–7.7)
Neutrophils Relative %: 69 %
Platelet Count: 253 10*3/uL (ref 150–400)
RBC: 4.51 MIL/uL (ref 3.87–5.11)
RDW: 13.9 % (ref 11.5–15.5)
WBC Count: 5.3 10*3/uL (ref 4.0–10.5)
nRBC: 0 % (ref 0.0–0.2)

## 2023-05-05 LAB — CMP (CANCER CENTER ONLY)
ALT: 18 U/L (ref 0–44)
AST: 20 U/L (ref 15–41)
Albumin: 4.7 g/dL (ref 3.5–5.0)
Alkaline Phosphatase: 45 U/L (ref 38–126)
Anion gap: 6 (ref 5–15)
BUN: 17 mg/dL (ref 6–20)
CO2: 30 mmol/L (ref 22–32)
Calcium: 9.4 mg/dL (ref 8.9–10.3)
Chloride: 102 mmol/L (ref 98–111)
Creatinine: 0.63 mg/dL (ref 0.44–1.00)
GFR, Estimated: >60 mL/min (ref >60)
Glucose, Bld: 95 mg/dL (ref 70–99)
Potassium: 3.7 mmol/L (ref 3.5–5.1)
Sodium: 138 mmol/L (ref 135–145)
Total Bilirubin: 0.4 mg/dL (ref 0.3–1.2)
Total Protein: 6.9 g/dL (ref 6.5–8.1)

## 2023-05-05 LAB — IRON AND IRON BINDING CAPACITY (CC-WL,HP ONLY)
Iron: 118 ug/dL (ref 28–170)
Saturation Ratios: 38 % — ABNORMAL HIGH (ref 10.4–31.8)
TIBC: 311 ug/dL (ref 250–450)
UIBC: 193 ug/dL (ref 148–442)

## 2023-05-05 LAB — FERRITIN: Ferritin: 84 ng/mL (ref 11–307)

## 2023-05-06 ENCOUNTER — Telehealth: Payer: Self-pay

## 2023-05-06 NOTE — Telephone Encounter (Signed)
Pt advised with VU 

## 2023-05-06 NOTE — Telephone Encounter (Signed)
-----   Message from Kyra Searles, RN sent at 05/05/2023  4:10 PM EDT -----  ----- Message ----- From: Raymondo Band Sent: 05/05/2023   3:44 PM EDT To: Kyra Searles, RN  Please notify patient that iron levels are normal. No need for additional IV iron.    ----- Message ----- From: Leory Plowman, Lab In [place] Sent: 05/05/2023   8:32 AM EDT To: Jaci Standard, MD

## 2023-05-15 ENCOUNTER — Inpatient Hospital Stay: Payer: 59 | Admitting: Hematology and Oncology

## 2023-05-15 NOTE — Progress Notes (Signed)
No show

## 2023-05-16 ENCOUNTER — Encounter: Payer: Self-pay | Admitting: Hematology and Oncology

## 2023-05-23 ENCOUNTER — Telehealth: Payer: 59 | Admitting: Physician Assistant

## 2023-05-23 DIAGNOSIS — T63441A Toxic effect of venom of bees, accidental (unintentional), initial encounter: Secondary | ICD-10-CM | POA: Diagnosis not present

## 2023-05-23 DIAGNOSIS — R609 Edema, unspecified: Secondary | ICD-10-CM | POA: Diagnosis not present

## 2023-05-23 MED ORDER — PREDNISONE 10 MG (21) PO TBPK
ORAL_TABLET | ORAL | 0 refills | Status: DC
Start: 2023-05-23 — End: 2023-09-03

## 2023-05-23 NOTE — Progress Notes (Signed)
Virtual Visit Consent   Amanda [name], you are scheduled for a virtual visit with a [facility] provider today. Just as with appointments in the office, your consent must be obtained to participate. Your consent will be active for this visit and any virtual visit you may have with one of our providers in the next 365 days. If you have a MyChart account, a copy of this consent can be sent to you electronically.  As this is a virtual visit, video technology does not allow for your provider to perform a traditional examination. This may limit your provider's ability to fully assess your condition. If your provider identifies any concerns that need to be evaluated in person or the need to arrange testing (such as labs, EKG, etc.), we will make arrangements to do so. Although advances in technology are sophisticated, we cannot ensure that it will always work on either your end or our end. If the connection with a video visit is poor, the visit may have to be switched to a telephone visit. With either a video or telephone visit, we are not always able to ensure that we have a secure connection.  By engaging in this virtual visit, you consent to the provision of healthcare and authorize for your insurance to be billed (if applicable) for the services provided during this visit. Depending on your insurance coverage, you may receive a charge related to this service.  I need to obtain your verbal consent now. Are you willing to proceed with your visit today? Amanda [name] has provided verbal consent on 05/23/2023 for a virtual visit (video or telephone). Margaretann Loveless, PA-C  Date: 05/23/2023 3:38 PM  Virtual Visit via Video Note   I, Margaretann Loveless, connected with  Amanda [name]  ([id number], 01-Oct-1981) on 05/23/23 at  3:45 PM EDT by a video-enabled telemedicine application and verified that I am speaking with the correct person using two identifiers.  Location: Patient: Virtual Visit Location  Patient: Home Provider: Virtual Visit Location Provider: Home Office   I discussed the limitations of evaluation and management by telemedicine and the availability of in person appointments. The patient expressed understanding and agreed to proceed.    History of Present Illness: Amanda [name] is a 42 y.o. who identifies as a female who was assigned female at birth, and is being seen today for bee sting.  HPI: Trauma The incident occurred 12 to 24 hours ago. The incident occurred at home. The injury mechanism was a cut/puncture wound (bee sting). The injury occurred in the context of other (bee sting). The pain is mild. It is unlikely that a foreign body is present. Pertinent negatives include no abdominal pain, coughing, difficulty breathing or headaches.  Possible wasp sting to left ankle. Now with swelling, itching and redness. Has a tendency to develop cellulitis with bee stings.   Problems:  Patient Active Problem List   Diagnosis Date Noted   Iron deficiency anemia secondary to inadequate dietary iron intake 01/14/2022   Iron deficiency anemia 05/08/2021   Femoral neck stress fracture, initial encounter 03/17/2019   Disturbance of skin sensation 11/22/2013   UPPER RESPIRATORY INFECTION, VIRAL 05/26/2008   HYPERLIPIDEMIA 12/04/2006   ANEMIA-NOS 12/04/2006   MIGRAINE HEADACHE 12/04/2006   ALLERGIC RHINITIS 12/04/2006   ASTHMA 12/04/2006   PLACENTA ABRUPTIO 12/04/2006    Allergies:  Allergies  Allergen Reactions   Cephalexin Swelling   Codeine    Darvocet A500 [Propoxyphene N-Acetaminophen]    Dilaudid [Hydromorphone  Hcl]    Hydrocodone-Acetaminophen    Latex    Macrobid [Nitrofurantoin Macrocrystal]    Morphine Sulfate    Sulfa Antibiotics    Tape Rash and Swelling   Medications:  Current Outpatient Medications:    predniSONE (STERAPRED UNI-PAK 21 TAB) 10 MG (21) TBPK tablet, 6 day taper; take as directed on package instructions, Disp: 21 tablet, Rfl:  0  Observations/Objective: Patient is well-developed, well-nourished in no acute distress.  Resting comfortably at home.  Head is normocephalic, atraumatic.  No labored breathing.  Speech is clear and coherent with logical content.  Patient is alert and oriented at baseline.    Assessment and Plan: 1. Bee sting, accidental or unintentional, initial encounter - predniSONE (STERAPRED UNI-PAK 21 TAB) 10 MG (21) TBPK tablet; 6 day taper; take as directed on package instructions  Dispense: 21 tablet; Refill: 0  2. Swelling - predniSONE (STERAPRED UNI-PAK 21 TAB) 10 MG (21) TBPK tablet; 6 day taper; take as directed on package instructions  Dispense: 21 tablet; Refill: 0  - Prednisone for bee sting - Ice - Cold compresses - Topical and/or oral benadryl - Topical Hydrocortisone - Calamine - Seek further evaluation if signs of infection develop  Follow Up Instructions: I discussed the assessment and treatment plan with the patient. The patient was provided an opportunity to ask questions and all were answered. The patient agreed with the plan and demonstrated an understanding of the instructions.  A copy of instructions were sent to the patient via MyChart unless otherwise noted below.    The patient was advised to call back or seek an in-person evaluation if the symptoms worsen or if the condition fails to improve as anticipated.  Time:  I spent 8 minutes with the patient via telehealth technology discussing the above problems/concerns.    Margaretann Loveless, PA-C

## 2023-05-23 NOTE — Patient Instructions (Signed)
Amanda [name], thank you for joining Margaretann Loveless, PA-C for today's virtual visit.  While this provider is not your primary care provider (PCP), if your PCP is located in our provider database this encounter information will be shared with them immediately following your visit.   A [facility] MyChart account gives you access to today's visit and all your visits, tests, and labs performed at [facility] " click here if you don't have a [facility] MyChart account or go to [email or web address]  Consent: (Patient) Amanda [name] provided verbal consent for this virtual visit at the beginning of the encounter.  Current Medications:  Current Outpatient Medications:    predniSONE (STERAPRED UNI-PAK 21 TAB) 10 MG (21) TBPK tablet, 6 day taper; take as directed on package instructions, Disp: 21 tablet, Rfl: 0   Medications ordered in this encounter:  Meds ordered this encounter  Medications   predniSONE (STERAPRED UNI-PAK 21 TAB) 10 MG (21) TBPK tablet    Sig: 6 day taper; take as directed on package instructions    Dispense:  21 tablet    Refill:  0    Order Specific Question:   Supervising Provider    Answer:   Merrilee Jansky [id number]     *If you need refills on other medications prior to your next appointment, please contact your pharmacy*  Follow-Up: Call back or seek an in-person evaluation if the symptoms worsen or if the condition fails to improve as anticipated.  [facility] [phone]  Other Instructions Bee, Wasp, or Hornet Sting, Adult Bees, wasps, and hornets are part of a family of insects that sting. Normally, a sting will cause pain, redness, and swelling at the sting site. However, some people have an allergy to these stings, and their reactions can be much more serious. What increases the risk? You may be at a greater risk of getting stung if you: Provoke a stinging insect by swatting or disturbing it. Wear  strong-smelling soaps, deodorants, or body sprays. Spend time outdoors near gardens with flowers or fruit trees or in clothes that expose skin. Eat or drink outside. What are the signs or symptoms? The reaction to an insect sting can vary from a mild, normal response to life-threatening anaphylaxis. The sting site is often a red lump in the skin, sometimes with a tiny hole in the center, that may still have the stinger in the center of the wound. Normal reaction A normal reaction is experienced by most people after an insect sting. Symptoms include: Pain, redness, and swelling at the sting site. These can develop over 24-48 hours. Pain, redness, and swelling that may spread to a larger, connected area beyond the sting site. The spreading can continue over 24-48 hours. Allergic reaction An allergic reaction can vary in severity and includes symptoms in other areas of the body beyond the sting site. People who experience an allergic reaction have a higher risk of having similar or worse symptoms the next time they are stung. Symptoms may include: Hives, itching, and swelling. Abdominal symptoms including cramping, nausea, vomiting, and diarrhea. Severe symptoms that require immediate medical attention include: Chest pain or tightness. Wheezing or trouble breathing. Swelling of the tongue, throat, or lips. Trouble swallowing or hoarse voice. Anaphylactic reaction An anaphylactic reaction is a severe, life-threatening allergy and requires immediate medical attention. The symptoms often include severe allergic reaction symptoms that develop rapidly and lead to: A sudden and sharp drop in blood pressure. Dizziness. Loss of consciousness.  How is this diagnosed? This condition is usually diagnosed based on your symptoms and medical history as well as a physical exam. You may have an allergy test to determine if you are allergic to the insect venom. How is this treated? If you were stung by a bee,  the stinger and a small sac of venom may be in the wound. Remove the stinger as soon as possible. Do this by brushing across the wound with gauze, a clean fingernail, or a flat card such as a credit card. This can help reduce the severity of your body's reaction to the sting. Normal reactions can be treated with: Washing the area thoroughly with soap and water. Applying ice to the area to reduce swelling. Oral or topical medicines to help reduce pain and itching, if present. Pay close attention to your symptoms after you have been stung. If possible, have someone stay with you to see if an allergic reaction develops. If allergic symptoms develop, oral antihistamines can be taken and you will need medical help right away. If you had an allergic reaction before, you may need to: Use an auto-injector "pen"(pre-filled automatic epinephrine injection device)at the first sign of an allergic reaction. Get medical help right away because the epinephrine is short-acting. It is intended to give you more time to get to an emergency room. Follow these instructions at home:  Wash the sting site 2-3 times a day with soap and water as told by your health care provider. Apply or take over-the-counter and prescription medicines only as told by your health care provider. If directed, apply ice to the sting area. Put ice in a plastic bag. Place a towel between your skin and the bag. Leave the ice on for 20 minutes, 2-3 times a day. Do not scratch the sting area. If you had a severe allergic reaction to a sting, you may need to: Wear a medical bracelet or necklace that lists the allergy. Carry an anaphylaxis kit or an epinephrine auto-injector "pen" with you at all times. Tell your family members, friends, and coworkers when and how to use it. Use it at the first sign of an allergic reaction. How is this prevented? Avoid swatting at stinging insects and disturbing insect nests. Do not use fragrant soaps or  lotions and avoid sitting near flowering plants, if possible. Wear shoes, pants, and long sleeves when spending time outdoors, especially in grassy areas where stinging insects are common. Keep outdoor areas free from nests or hives. Keep food and drink containers covered when eating outdoors. Wear gloves if you are gardening or working outdoors. Find a barrier between you and the insect(s), such as a door, if an attack by a stinging insect or a swarm seems likely. Contact a health care provider if: Your symptoms do not get better in 2-3 days. You have redness, swelling, or pain that spreads beyond the area of the sting. You have a fever. Get help right away if: You have symptoms of a severe allergic reaction. These include: Chest tightness or pain. Wheezing, or trouble swallowing or breathing. Light-headedness, dizziness, or fainting. Itchy, raised, red patches on the skin beyond the sting site. Abdominal cramping, nausea, vomiting, or diarrhea. Trouble swallowing or a swollen tongue, throat, or lips. These symptoms may be an emergency. Get help right away. Call 911. Do not wait to see if the symptoms will go away. Do not drive yourself to the hospital. Summary Stings from bees, wasps, and hornets can cause pain and swelling, but  they are usually not serious. However, some people may have an allergic reaction to a sting. This can cause the symptoms to be more severe. Pay close attention to your symptoms after you have been stung. If possible, have someone stay with you to look for signs of worsening symptoms. Call your health care provider if you have any signs of an allergic reaction. This information is not intended to replace advice given to you by your health care provider. Make sure you discuss any questions you have with your health care provider. Document Revised: 02/12/2022 Document Reviewed: 02/12/2022 Elsevier Patient Education  2024 Elsevier Inc.    If you have been  instructed to have an in-person evaluation today at a local Urgent Care facility, please use the link below. It will take you to a list of all of our available [facility], including address, phone number and hours of operation. Please do not delay care.  [facility]  If you or a family member do not have a primary care provider, use the link below to schedule a visit and establish care. When you choose a [facility] primary care physician or advanced practice provider, you gain a long-term partner in health. Find a Primary Care Provider  Learn more about [facility] in-office and virtual care options: [facility] - Get Care Now

## 2023-05-27 ENCOUNTER — Telehealth: Payer: Self-pay | Admitting: Hematology and Oncology

## 2023-05-27 ENCOUNTER — Encounter: Payer: Self-pay | Admitting: Hematology and Oncology

## 2023-05-27 DIAGNOSIS — M2550 Pain in unspecified joint: Secondary | ICD-10-CM | POA: Insufficient documentation

## 2023-05-27 DIAGNOSIS — Z8742 Personal history of other diseases of the female genital tract: Secondary | ICD-10-CM | POA: Insufficient documentation

## 2023-05-27 DIAGNOSIS — Z8709 Personal history of other diseases of the respiratory system: Secondary | ICD-10-CM | POA: Insufficient documentation

## 2023-05-27 DIAGNOSIS — Z889 Allergy status to unspecified drugs, medicaments and biological substances status: Secondary | ICD-10-CM | POA: Insufficient documentation

## 2023-05-27 DIAGNOSIS — N12 Tubulo-interstitial nephritis, not specified as acute or chronic: Secondary | ICD-10-CM | POA: Insufficient documentation

## 2023-06-02 ENCOUNTER — Inpatient Hospital Stay: Payer: 59 | Attending: Hematology and Oncology

## 2023-06-02 ENCOUNTER — Other Ambulatory Visit: Payer: Self-pay | Admitting: Hematology and Oncology

## 2023-06-02 ENCOUNTER — Other Ambulatory Visit: Payer: Self-pay | Admitting: *Deleted

## 2023-06-02 DIAGNOSIS — D509 Iron deficiency anemia, unspecified: Secondary | ICD-10-CM

## 2023-06-02 LAB — CBC WITH DIFFERENTIAL (CANCER CENTER ONLY)
Abs Immature Granulocytes: 0.01 10*3/uL (ref 0.00–0.07)
Basophils Absolute: 0 10*3/uL (ref 0.0–0.1)
Basophils Relative: 1 %
Eosinophils Absolute: 0.1 10*3/uL (ref 0.0–0.5)
Eosinophils Relative: 2 %
HCT: 38.3 % (ref 36.0–46.0)
Hemoglobin: 13.3 g/dL (ref 12.0–15.0)
Immature Granulocytes: 0 %
Lymphocytes Relative: 27 %
Lymphs Abs: 1.5 10*3/uL (ref 0.7–4.0)
MCH: 32.3 pg (ref 26.0–34.0)
MCHC: 34.7 g/dL (ref 30.0–36.0)
MCV: 93 fL (ref 80.0–100.0)
Monocytes Absolute: 0.5 10*3/uL (ref 0.1–1.0)
Monocytes Relative: 8 %
Neutro Abs: 3.6 10*3/uL (ref 1.7–7.7)
Neutrophils Relative %: 62 %
Platelet Count: 263 10*3/uL (ref 150–400)
RBC: 4.12 MIL/uL (ref 3.87–5.11)
RDW: 14 % (ref 11.5–15.5)
WBC Count: 5.7 10*3/uL (ref 4.0–10.5)
nRBC: 0 % (ref 0.0–0.2)

## 2023-06-02 LAB — CMP (CANCER CENTER ONLY)
ALT: 24 U/L (ref 0–44)
AST: 27 U/L (ref 15–41)
Albumin: 4.6 g/dL (ref 3.5–5.0)
Alkaline Phosphatase: 41 U/L (ref 38–126)
Anion gap: 4 — ABNORMAL LOW (ref 5–15)
BUN: 16 mg/dL (ref 6–20)
CO2: 28 mmol/L (ref 22–32)
Calcium: 9.1 mg/dL (ref 8.9–10.3)
Chloride: 105 mmol/L (ref 98–111)
Creatinine: 0.67 mg/dL (ref 0.44–1.00)
GFR, Estimated: >60 mL/min (ref >60)
Glucose, Bld: 92 mg/dL (ref 70–99)
Potassium: 3.3 mmol/L — ABNORMAL LOW (ref 3.5–5.1)
Sodium: 137 mmol/L (ref 135–145)
Total Bilirubin: 0.4 mg/dL (ref 0.3–1.2)
Total Protein: 6.7 g/dL (ref 6.5–8.1)

## 2023-06-02 LAB — FERRITIN: Ferritin: 70 ng/mL (ref 11–307)

## 2023-06-02 LAB — IRON AND IRON BINDING CAPACITY (CC-WL,HP ONLY)
Iron: 56 ug/dL (ref 28–170)
Saturation Ratios: 20 % (ref 10.4–31.8)
TIBC: 287 ug/dL (ref 250–450)
UIBC: 231 ug/dL (ref 148–442)

## 2023-06-02 LAB — RETIC PANEL
Immature Retic Fract: 6.3 % (ref 2.3–15.9)
RBC.: 4.04 MIL/uL (ref 3.87–5.11)
Retic Count, Absolute: 55.3 10*3/uL (ref 19.0–186.0)
Retic Ct Pct: 1.4 % (ref 0.4–3.1)
Reticulocyte Hemoglobin: 36.5 pg (ref >27.9)

## 2023-06-03 ENCOUNTER — Telehealth: Payer: Self-pay | Admitting: *Deleted

## 2023-06-03 NOTE — Telephone Encounter (Signed)
-----   Message from Jaci Standard, MD sent at 06/03/2023  9:46 AM EDT ----- Please let Amanda [name] know that her labs show ferritin of 70 and a Hgb 13.3. These levels are excellent. We will plan to see her back in August 2024 as scheduled.   ----- Message ----- From: Leory Plowman, Lab In [place] Sent: 06/02/2023  12:16 PM EDT To: Jaci Standard, MD

## 2023-06-03 NOTE — Telephone Encounter (Signed)
LM with note below.  To call if any questions 

## 2023-08-22 ENCOUNTER — Telehealth: Payer: Self-pay | Admitting: *Deleted

## 2023-08-22 NOTE — Telephone Encounter (Signed)
[name] patient regarding her appts on 08/25/23. Spoke with her. Advised that normally Dr. Leonides Schanz prefers to get her labs a week prior to her clinic visit. Advised that this time her lab appt was scheduled the same day as her lab appt. Asked pt if she was ok to have her clinic visit moved a week out. Pt is very agreeable to this.  Advised that we can see her on 09/03/23 @ 1:20 pm. Pt voiced understanding.  Scheduling message sent

## 2023-08-24 ENCOUNTER — Encounter: Payer: Self-pay | Admitting: Hematology and Oncology

## 2023-08-25 ENCOUNTER — Inpatient Hospital Stay: Payer: 59 | Admitting: Hematology and Oncology

## 2023-08-25 ENCOUNTER — Other Ambulatory Visit: Payer: Self-pay | Admitting: *Deleted

## 2023-08-25 ENCOUNTER — Inpatient Hospital Stay: Payer: 59 | Attending: Hematology and Oncology

## 2023-08-25 DIAGNOSIS — D509 Iron deficiency anemia, unspecified: Secondary | ICD-10-CM

## 2023-08-25 LAB — CMP (CANCER CENTER ONLY)
ALT: 35 U/L (ref 0–44)
AST: 36 U/L (ref 15–41)
Albumin: 5 g/dL (ref 3.5–5.0)
Alkaline Phosphatase: 54 U/L (ref 38–126)
Anion gap: 6 (ref 5–15)
BUN: 18 mg/dL (ref 6–20)
CO2: 28 mmol/L (ref 22–32)
Calcium: 9.7 mg/dL (ref 8.9–10.3)
Chloride: 104 mmol/L (ref 98–111)
Creatinine: 0.68 mg/dL (ref 0.44–1.00)
GFR, Estimated: >60 mL/min (ref >60)
Glucose, Bld: 93 mg/dL (ref 70–99)
Potassium: 3.8 mmol/L (ref 3.5–5.1)
Sodium: 138 mmol/L (ref 135–145)
Total Bilirubin: 0.4 mg/dL (ref 0.3–1.2)
Total Protein: 7.6 g/dL (ref 6.5–8.1)

## 2023-08-25 LAB — CBC WITH DIFFERENTIAL (CANCER CENTER ONLY)
Abs Immature Granulocytes: 0.01 10*3/uL (ref 0.00–0.07)
Basophils Absolute: 0 10*3/uL (ref 0.0–0.1)
Basophils Relative: 1 %
Eosinophils Absolute: 0.1 10*3/uL (ref 0.0–0.5)
Eosinophils Relative: 2 %
HCT: 42.2 % (ref 36.0–46.0)
Hemoglobin: 14.2 g/dL (ref 12.0–15.0)
Immature Granulocytes: 0 %
Lymphocytes Relative: 23 %
Lymphs Abs: 1.2 10*3/uL (ref 0.7–4.0)
MCH: 32 pg (ref 26.0–34.0)
MCHC: 33.6 g/dL (ref 30.0–36.0)
MCV: 95 fL (ref 80.0–100.0)
Monocytes Absolute: 0.4 10*3/uL (ref 0.1–1.0)
Monocytes Relative: 8 %
Neutro Abs: 3.5 10*3/uL (ref 1.7–7.7)
Neutrophils Relative %: 66 %
Platelet Count: 232 10*3/uL (ref 150–400)
RBC: 4.44 MIL/uL (ref 3.87–5.11)
RDW: 12.4 % (ref 11.5–15.5)
WBC Count: 5.2 10*3/uL (ref 4.0–10.5)
nRBC: 0 % (ref 0.0–0.2)

## 2023-08-25 LAB — TSH: TSH: 0.975 u[IU]/mL (ref 0.350–4.500)

## 2023-08-25 LAB — RETIC PANEL
Immature Retic Fract: 3.7 % (ref 2.3–15.9)
RBC.: 4.51 MIL/uL (ref 3.87–5.11)
Retic Count, Absolute: 36.1 10*3/uL (ref 19.0–186.0)
Retic Ct Pct: 0.8 % (ref 0.4–3.1)
Reticulocyte Hemoglobin: 34.1 pg (ref >27.9)

## 2023-08-25 LAB — IRON AND IRON BINDING CAPACITY (CC-WL,HP ONLY)
Iron: 67 ug/dL (ref 28–170)
Saturation Ratios: 20 % (ref 10.4–31.8)
TIBC: 332 ug/dL (ref 250–450)
UIBC: 265 ug/dL (ref 148–442)

## 2023-08-25 LAB — VITAMIN B12: Vitamin B-12: 974 pg/mL — ABNORMAL HIGH (ref 180–914)

## 2023-08-25 LAB — LIPID PANEL
Cholesterol: 232 mg/dL — ABNORMAL HIGH (ref 0–200)
HDL: 88 mg/dL (ref >40)
LDL Cholesterol: 134 mg/dL — ABNORMAL HIGH (ref 0–99)
Total CHOL/HDL Ratio: 2.6 RATIO
Triglycerides: 49 mg/dL (ref <150)
VLDL: 10 mg/dL (ref 0–40)

## 2023-08-25 LAB — URIC ACID: Uric Acid, Serum: 3.3 mg/dL (ref 2.5–7.1)

## 2023-08-25 LAB — FERRITIN: Ferritin: 57 ng/mL (ref 11–307)

## 2023-08-25 LAB — PHOSPHORUS: Phosphorus: 3.8 mg/dL (ref 2.5–4.6)

## 2023-08-25 LAB — VITAMIN D 25 HYDROXY (VIT D DEFICIENCY, FRACTURES): Vit D, 25-Hydroxy: 67.79 ng/mL (ref 30–100)

## 2023-08-26 LAB — T3: T3, Total: 97 ng/dL (ref 71–180)

## 2023-08-26 LAB — T4: T4, Total: 7.1 ug/dL (ref 4.5–12.0)

## 2023-09-03 ENCOUNTER — Inpatient Hospital Stay: Payer: 59 | Attending: Hematology and Oncology | Admitting: Hematology and Oncology

## 2023-09-03 VITALS — BP 123/84 | HR 68 | Temp 97.6°F | Resp 13

## 2023-09-03 DIAGNOSIS — D509 Iron deficiency anemia, unspecified: Secondary | ICD-10-CM | POA: Diagnosis not present

## 2023-09-03 DIAGNOSIS — Z87891 Personal history of nicotine dependence: Secondary | ICD-10-CM | POA: Diagnosis not present

## 2023-09-03 NOTE — Progress Notes (Addendum)
 [facility] [phone]   [phone]  PROGRESS NOTE  Patient Care Team: Randol Dawes, MD as PCP - General (Family Medicine)  Hematological/Oncological History # Iron  Deficiency Anemia of Unclear Etiology  05/08/2021: WBC 4.5, Hgb 9.7, MCV 64.8, Plt 323, Iron  14, Sat 2.4% 05/24/2021: Iron  Sucrose 500 mg x 2 doses 06/29/2021: WBC 5.7, Hgb 12.6, MCV 82, Plt 210. Iron  89, Sat 21.3% 07/24/2021: EGD performed, no evidence of celiac disease or clear source of bleeding. Iron  Sat 16%, ferritin 19.9 08/20/2021: establish care with Dr. Federico  01/18/2022-02/18/2022: IV iron  sucrose 200 mg x 4 doses. 04/15/2022: Ferritin <4, Sat ratio 6%, TIBC 490, Hgb 12.0, WBC 6.8, Plt 280  05/16/2022: IV monoferric  1000 mg x 1 dose.  06/24/2022: White blood cell 6.9, hemoglobin 11.2, MCV 89, and platelets of 242.  Additional labs show saturation 14% total iron  binding capacity 344 06/27/2022: IV monoferric  1000 mg x 1 dose.  11/14/2022: WBC 5.3, Hgb 13.6, MCV 97.8, Plt 224, Sat 46%  Interval History:  Amanda [name] 42 y.o. female with medical history significant for iron  deficiency anemia of unclear etiology who presents for a follow up visit. The patient's last visit was on 05/15/2023. In the interim since the last she has gone gluten-free due to concern for celiac disease.  On exam today Mr. Slagel reports she recently received a string of diagnoses from blood testing from a nutritionist.  She notes that she has a protozoal infection, what appears to be a tissue transglutaminase antibody (consistent with celiac disease) and H. pylori.  She reports that she has been gluten-free for the past 2 weeks and has noticed that if she does accidentally eat gluten she has blisters in her mouth.  She notes that she does get abdominal bloating and excessively large bowel movements when eating gluten but this has been reduced since she stopped eating bread and bread products.  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  She reports that for these infections she has not received any antibiotic therapy.  She reports that she would like to be evaluated further by Dr. Albertus to confirm and validate these test.  She notes that she also felt really good when she was out of the country for 3 weeks in [place], [place], and [place].  She reports also in the interim since her last visit she developed a broken pelvis.  We done additional testing for her which has shown normal levels of vitamin D  and no other concerning abnormalities.  Fortunately her iron  levels are remaining stable and currently has a ferritin over 50..  She otherwise denies any fevers, chills, sweats, nausea, vomiting or diarrhea.  Full 10 point ROS is listed below.  MEDICAL HISTORY:  Past Medical History:  Diagnosis Date   Adhesive capsulitis of shoulder    Allergy    Childhood asthma    Heart murmur    History of migraine headaches    started age 83, milder with age. some ocular migraines   Hypercalcemia    noted as cause for kidney stones during pregnancy   IDA (iron  deficiency anemia)    Kidney stones    during pregnancy with daughter; under care of [facility]. Calcium Oxalate   Pain in right shoulder    Placental abruption    chronic, with pregnancy; delivered at 24wk 6 d, lived 6 days,  emergency c-section   Shoulder impingement syndrome    Stress fracture of femur    x 2 per patient   Thoracic outlet syndrome    Vegan diet     SURGICAL HISTORY: Past Surgical History:  Procedure Laterality Date   CESAREAN SECTION     NEPHROSTOMY     SHOULDER ARTHROSCOPY Right 12/2012, 07-13-2013   URETERAL STENT PLACEMENT      SOCIAL HISTORY: Social History   Socioeconomic History   Marital status: Married    Spouse name: Not on file   Number of children: Not on file   Years of education: Not on file   Highest  education level: Not on file  Occupational History   Not on file  Tobacco Use   Smoking status: Former    Current packs/day: 0.00    Types: Cigarettes    Quit date: 12/30/2000    Years since quitting: 22.6   Smokeless tobacco: Never   Tobacco comments:    occasional use during college  Vaping Use   Vaping status: Never Used  Substance and Sexual Activity   Alcohol use: Not Currently   Drug use: No   Sexual activity: Yes    Partners: Male    Comment: husband with vasectomy  Other Topics Concern   Not on file  Social History Narrative   Married. 79 yo daughter.   Runs a farm, horse-boarding.   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   Run 30 miles/week   Vegan diet (not super strict, will eat some baked goods that has eggs).   From [place], [place]    Social Determinants of Health   Financial Resource Strain: Not on file  Food Insecurity: Not on file  Transportation Needs: Not on file  Physical Activity: Not on file  Stress: Not on file  Social Connections: Not on file  Intimate Partner Violence: Not on file    FAMILY HISTORY: Family History  Problem Relation Age of Onset   Hyperlipidemia Mother    Hypothyroidism Mother    Hyperthyroidism Maternal Uncle    Hyperthyroidism Maternal Grandmother    Heart disease Maternal Grandmother        died 44 from MI   Heart disease Maternal Grandfather        CABG    Allergies Daughter    Cancer Neg Hx    Colon cancer Neg Hx    Pancreatic cancer Neg Hx    Esophageal cancer Neg Hx    Colon polyps Neg Hx    Rectal cancer Neg Hx    Stomach cancer Neg Hx     ALLERGIES:  is allergic to benzoyl peroxide, cephalexin, codeine, darvocet a500 [propoxyphene n-acetaminophen ], dilaudid [hydromorphone hcl], hydrocodone-acetaminophen , latex, macrobid [nitrofurantoin macrocrystal], morphine sulfate, propoxyphene, sulfa antibiotics, and tape.  MEDICATIONS:  No current outpatient medications on file.   No current facility-administered  medications for this visit.    REVIEW OF SYSTEMS:   Constitutional: ( - ) fevers, ( - )  chills , ( - ) night sweats Eyes: ( - ) blurriness of vision, ( - ) double vision, ( - ) watery eyes Ears, nose, mouth, throat, and face: ( - ) mucositis, ( - ) sore throat Respiratory: ( - ) cough, ( - ) dyspnea, ( - ) wheezes Cardiovascular: ( - ) palpitation, ( - ) chest discomfort, ( - ) lower extremity swelling Gastrointestinal:  ( - ) nausea, ( - ) heartburn, ( - ) change in bowel habits Skin: ( - ) abnormal skin  rashes Lymphatics: ( - ) new lymphadenopathy, ( - ) easy bruising Neurological: ( - ) numbness, ( - ) tingling, ( - ) new weaknesses Behavioral/Psych: ( - ) mood change, ( - ) new changes  All other systems were reviewed with the patient and are negative.  PHYSICAL EXAMINATION:  Vitals:   09/03/23 1334  BP: 123/84  Pulse: 68  Resp: 13  Temp: 97.6 F (36.4 C)  SpO2: 100%      Filed Weights       GENERAL: Well-appearing athletic middle-age Caucasian female, alert, no distress and comfortable SKIN: skin color, texture, turgor are normal, no rashes or significant lesions EYES: conjunctiva are pink and non-injected, sclera clear LUNGS: clear to auscultation and percussion with normal breathing effort HEART: regular rate & rhythm and no murmurs and no lower extremity edema Musculoskeletal: no cyanosis of digits and no clubbing  PSYCH: alert & oriented x 3, fluent speech NEURO: no focal motor/sensory deficits  LABORATORY DATA:  I have reviewed the data as listed    Latest Ref Rng & Units 08/25/2023   10:08 AM 06/02/2023   11:54 AM 05/05/2023    8:07 AM  CBC  WBC 4.0 - 10.5 K/uL 5.2  5.7  5.3   Hemoglobin 12.0 - 15.0 g/dL 85.7  86.6  85.9   Hematocrit 36.0 - 46.0 % 42.2  38.3  41.7   Platelets 150 - 400 K/uL 232  263  253        Latest Ref Rng & Units 08/25/2023   10:08 AM 06/02/2023   11:54 AM 05/05/2023    8:07 AM  CMP  Glucose 70 - 99 mg/dL 93  92  95   BUN 6 - 20  mg/dL 18  16  17    Creatinine 0.44 - 1.00 mg/dL 9.31  9.32  9.36   Sodium 135 - 145 mmol/L 138  137  138   Potassium 3.5 - 5.1 mmol/L 3.8  3.3  3.7   Chloride 98 - 111 mmol/L 104  105  102   CO2 22 - 32 mmol/L 28  28  30    Calcium 8.9 - 10.3 mg/dL 9.7  9.1  9.4   Total Protein 6.5 - 8.1 g/dL 7.6  6.7  6.9   Total Bilirubin 0.3 - 1.2 mg/dL 0.4  0.4  0.4   Alkaline Phos 38 - 126 U/L 54  41  45   AST 15 - 41 U/L 36  27  20   ALT 0 - 44 U/L 35  24  18     RADIOGRAPHIC STUDIES: No results found.  ASSESSMENT & PLAN AUDRINA MARTEN 42 y.o. female with medical history significant for iron  deficiency anemia of unclear etiology who presents for a follow up visit.  After review of the labs, review of the records, and discussion with the patient the patients findings are most consistent with iron  deficiency anemia secondary to vegan diet and menstrual cycles.  Despite numerous IV iron  infusions the patient's iron  deficiency persists in the above etiologies do not adequately explain her continued low levels of ferritin.  No evidence of GI bleeding on EGD, capsule endoscopy and colonoscopy.   She has tried p.o. iron  therapy before in the past with unfortunate GI upset.  She responded well to her IV iron  sucrose 500 mg x 2 doses, but required additional 2 doses of IV Monoferric  with the last one being in June 2023.  Her hemoglobin and iron  levels have stayed elevated since  that time.  # Iron  Deficiency Anemia of Unclear Etiology -- no evidence of GI Bleeding on EGD or colonoscopy. Additionally she had a negative capsule endoscopy --patient not able to tolerate PO iron  therapy due to GI upset --Patient is a vegan and does not have particularly heavy menstrual cycles.  She does not believe these to be the etiology of her iron  deficiency. --Because we are unable to find a clear etiology for the patient's iron  deficiency the patient has requested a referral to [facility] for further  evaluation --patient responded well to IV iron  sucrose 500mg  x 2 doses with robust boost in Hgb, but her ferritin levels remained <30.  --last IV iron  treatment was IV Monoferric  on 02/13/2023. Ferritin improved to 57 at last check on 08/25/2023. -- labs today show white blood cell 5.2, hemoglobin 14.2, MCV 95, and platelets of 232 --Encouraged iron  rich foods in her diet.  --Return to clinic in 6 months time with interval 3 month lab checks (per patient request)   ## ADDENDUM: Patient has responded poorly to other iron  formulations other than Monoferric .  She had a robust improvement in her hemoglobin and iron  levels with Monoferric  which we did not see with other formulations of iron .  Therefore we are recommending and requesting continued Monoferric  moving forward.  No orders of the defined types were placed in this encounter.   All questions were answered. The patient knows to call the clinic with any problems, questions or concerns.  A total of more than 30 minutes were spent on this encounter with face-to-face time and non-face-to-face time, including preparing to see the patient, ordering tests and/or medications, counseling the patient and coordination of care as outlined above.   Norleen IVAR Kidney, MD  Department of Hematology/Oncology [facility] Phone: [phone] Pager: [phone] Email: [email or web address]  09/06/2023 5:33 PM

## 2023-09-08 ENCOUNTER — Telehealth: Payer: Self-pay | Admitting: Internal Medicine

## 2023-09-08 NOTE — Telephone Encounter (Signed)
Spoke with patient and she is going to email the lab results to me. I will place them on Dr. Lauro Franklin office for review.

## 2023-09-08 NOTE — Telephone Encounter (Signed)
Pt states she was referred back to Dr. Rhea Belton from Dr. Leonides Schanz. She is scheduled to see Dr. Rhea Belton in October but wants to know if Dr. Rhea Belton would be willing to prescribe/treat what the nutritionist found on testing.  See below from Dr. Leonides Schanz:  On exam today Mr. Dutt reports she recently received a string of diagnoses from blood testing from a nutritionist.  She notes that she has a protozoal infection, what appears to be a tissue transglutaminase antibody (consistent with celiac disease) and H. pylori.  She reports that she has been gluten-free for the past 2 weeks and has noticed that if she does accidentally eat gluten she has blisters in her mouth.  She notes that she does get abdominal bloating and excessively large bowel movements when eating gluten but this has been reduced since she stopped eating bread and bread products.  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  She reports that for these infections she has not received any antibiotic therapy.  She reports that she would like to be evaluated further by Dr. Rhea Belton to confirm and validate these test.    Please advise. She states the labs were supposed to be sent to Dr. Rhea Belton.

## 2023-09-08 NOTE — Telephone Encounter (Signed)
Patient attached lab results to a Mychart message for Dr. Rhea Belton to review.

## 2023-09-08 NOTE — Telephone Encounter (Signed)
Amanda [name] Could you try to track down the labs the patient is referencing I have not yet seen these but would like to review before recommending any treatment The patient did not have H. pylori or celiac by gastric and duodenal biopsies, respectively when initially worked up for IDA in my clinic TTG performed previously was negative Carie Caddy. Hrishikesh Hoeg, M.D.  09/08/2023

## 2023-09-10 ENCOUNTER — Encounter: Payer: Self-pay | Admitting: Internal Medicine

## 2023-09-10 ENCOUNTER — Other Ambulatory Visit: Payer: Self-pay

## 2023-09-10 DIAGNOSIS — R768 Other specified abnormal immunological findings in serum: Secondary | ICD-10-CM

## 2023-09-10 MED ORDER — METRONIDAZOLE 500 MG PO TABS: 500.0000 mg | ORAL_TABLET | Freq: Three times a day (TID) | ORAL | 0 refills | Status: AC

## 2023-09-10 NOTE — Telephone Encounter (Signed)
I have sent additional documentation after reviewing the lab work I am going to treat Blastocystis with metronidazole 500 mg 3 times daily x 10 days Additional lab work has been recommended and ordered for celiac

## 2023-09-10 NOTE — Telephone Encounter (Signed)
Please let patient know that I have reviewed her [facility] laboratory; GI-MAP test I do think there are some potentially interesting findings Blastocystis infection can cause symptoms in some patients --I would recommend we treat this with metronidazole 500 mg 3 times daily x 10 days Also with the positive antigliadin antibody I think we should repeat tissue transglutaminase IGA and IGA; also please repeat antigliadin antibody

## 2023-09-12 ENCOUNTER — Other Ambulatory Visit: Payer: 59

## 2023-09-12 DIAGNOSIS — R768 Other specified abnormal immunological findings in serum: Secondary | ICD-10-CM

## 2023-09-13 LAB — GLIADIN ANTIBODIES, SERUM
Gliadin IgA: <1 U/mL
Gliadin IgG: <1 U/mL

## 2023-09-13 LAB — TISSUE TRANSGLUTAMINASE, IGA: (tTG) Ab, IgA: <1 U/mL

## 2023-09-13 LAB — IGA: Immunoglobulin A: 74 mg/dL (ref 47–310)

## 2023-10-23 ENCOUNTER — Ambulatory Visit (INDEPENDENT_AMBULATORY_CARE_PROVIDER_SITE_OTHER): Payer: 59 | Admitting: Internal Medicine

## 2023-10-23 ENCOUNTER — Other Ambulatory Visit: Payer: 59

## 2023-10-23 ENCOUNTER — Encounter: Payer: Self-pay | Admitting: Internal Medicine

## 2023-10-23 VITALS — BP 96/60 | HR 68 | Ht 65.0 in

## 2023-10-23 DIAGNOSIS — R768 Other specified abnormal immunological findings in serum: Secondary | ICD-10-CM | POA: Diagnosis not present

## 2023-10-23 DIAGNOSIS — D509 Iron deficiency anemia, unspecified: Secondary | ICD-10-CM | POA: Diagnosis not present

## 2023-10-23 DIAGNOSIS — A078 Other specified protozoal intestinal diseases: Secondary | ICD-10-CM | POA: Diagnosis not present

## 2023-10-23 NOTE — Patient Instructions (Signed)
Your provider has requested that you go to the basement level for lab work before leaving today. Press "B" on the elevator. The lab is located at the first door on the left as you exit the elevator.  Your goal is 2,000 calorie intake a day.   _______________________________________________________  If your blood pressure at your visit was 140/90 or greater, please contact your primary care physician to follow up on this.  _______________________________________________________  If you are age 30 or older, your body mass index should be between 23-30. Your Body mass index is 18.34 kg/m. If this is out of the aforementioned range listed, please consider follow up with your Primary Care Provider.  If you are age 60 or younger, your body mass index should be between 19-25. Your Body mass index is 18.34 kg/m. If this is out of the aformentioned range listed, please consider follow up with your Primary Care Provider.   ________________________________________________________  The [place] [facility] providers would like to encourage you to use [facility] to communicate with providers for non-urgent requests or questions.  Due to long hold times on the telephone, sending your provider a message by [facility] may be a faster and more efficient way to get a response.  Please allow 48 business hours for a response.  Please remember that this is for non-urgent requests.  _______________________________________________________

## 2023-10-23 NOTE — Progress Notes (Signed)
Subjective:    Patient ID: Amanda [name], female    DOB: 1981-11-17, 42 y.o.   MRN: [id number]  HPI Amanda [name] is a 42 year old female with a history of iron deficiency anemia, multiple fractures including 3 high risk fractures (femur and pelvis), recent treatment for Blastocystis hominis with metronidazole who is here for follow-up.  She is here alone today and I initially saw her in May 2022 to evaluate iron deficiency anemia.  At that time she underwent upper endoscopy, colonoscopy and capsule endoscopy.  The studies were unremarkable with the exception of inflammation in the esophagus consistent with reflux.  There is no evidence of H. pylori or celiac by biopsy.  Her small bowel capsule endoscopy was normal with good prep.  She has been established with Dr. Leonides Schanz with hematology and being getting IV iron on a regular basis.  She has had 10 iron infusions over the last 2 years.  Monoferric seem to work the best and her last iron infusion was in February.  So far her ferritin has remained normal.  She was seen by dietitian and at that time had a low-level positive antigliadin antibody and also had Blastocystis found in her stool test.  We treated her for Blastocystis with metronidazole which she completed.  She has had unfortunately a pelvic fracture in March just before she was planning to run the [employer] and that she was unable to run.  This is her 10th overall fracture and third high risk fracture.  She has been seen by Dr. Albertha Ghee with orthopedic surgery and was told perhaps she could not eat enough calories to support her nutrition and that she should reduce her average running distance.  She was previously running 60 to 70 miles per week and now she is running more like 40 to 45 miles per week.  Her average calorie requirement is around 2500 cal/day and she states that she is "lucky if she can get 1000".  She has gained 10 pounds.  After her plied and antibody was positive  she went gluten-free in August and has been strict since that time though she had reduced her gluten intake around April after her pelvic fracture.  She is not having any specific GI symptoms.  No visible blood in stool or melena.  Celiac disease does run on her father side. Her vitamin D levels and bone mineral density testing were normal.   Review of Systems As per HPI, otherwise negative  Current Medications, Allergies, Past Medical History, Past Surgical History, Family History and Social History were reviewed in [name].    Objective:   Physical Exam BP 96/60 (BP Location: Left Arm, Patient Position: Sitting, Cuff Size: Normal)   Pulse 68   Ht 5\' 5"  (1.651 m) Comment: height measured without shoes  LMP 10/23/2023   BMI 18.34 kg/m  Gen: awake, alert, NAD HEENT: anicteric  Neuro: nonfocal  Iron/TIBC/Ferritin/ %Sat    Component Value Date/Time   IRON 67 08/25/2023 1008   IRON 12 (L) 04/19/2021 1351   TIBC 332 08/25/2023 1008   FERRITIN 57 08/25/2023 1010   FERRITIN 4 (L) 04/19/2021 1351   IRONPCTSAT 20 08/25/2023 1008      Latest Ref Rng & Units 08/25/2023   10:08 AM 06/02/2023   11:54 AM 05/05/2023    8:07 AM  CBC  WBC 4.0 - 10.5 K/uL 5.2  5.7  5.3   Hemoglobin 12.0 - 15.0 g/dL 47.8  29.5  14.0  Hematocrit 36.0 - 46.0 % 42.2  38.3  41.7   Platelets 150 - 400 K/uL 232  263  253    CMP     Component Value Date/Time   NA 138 08/25/2023 1008   K 3.8 08/25/2023 1008   CL 104 08/25/2023 1008   CO2 28 08/25/2023 1008   GLUCOSE 93 08/25/2023 1008   BUN 18 08/25/2023 1008   CREATININE 0.68 08/25/2023 1008   CALCIUM 9.7 08/25/2023 1008   PROT 7.6 08/25/2023 1008   ALBUMIN 5.0 08/25/2023 1008   AST 36 08/25/2023 1008   ALT 35 08/25/2023 1008   ALKPHOS 54 08/25/2023 1008   BILITOT 0.4 08/25/2023 1008   GFR 105.07 06/29/2021 1245   GFRNONAA >60 08/25/2023 1008   TTG and antigliadin --negative  Lab Results  Component Value Date    VITAMINB12 974 (H) 08/25/2023        Assessment & Plan:  42 year old female with a history of iron deficiency anemia, multiple fractures including 3 high risk fractures (femur and pelvis), recent treatment for Blastocystis hominis with metronidazole who is here for follow-up.   Recurrent iron deficiency/borderline positive antigliadin antibody on an outside lab most recently negative but on gluten-free diet/recurrent fractures --it does seem that she is not getting adequate caloric intake for her activity level although she has not lost significant weight and she does not appear malnourished or cachectic.  I am suspicious for celiac disease given her family history on her father side as well as her previous significant iron deficiency without other cause.  On small bowel biopsies in 2022 she was gluten-free and thus her small bowel biopsies were normal.  She also did not ever have a positive TTG or antigliadin antibody on my testing.  After thorough discussion I recommended the following: -- HLA celiac DNA testing -- If positive she should remain gluten-free and I think we could consider her as having celiac -- If negative I think she should reintroduce gluten, monitor iron stores and if recurrent iron deficiency repeat small bowel biopsies while eating dietary gluten  2. IDA --follow-up with hematology; ferritin is checked frequently and fortunately she has not needed IV iron in 8 months  3.  Multiple bone fractures --following with orthopedics, stress related in the setting of long distance running but again more than one would expect for running alone, see #1 and 2 above  4.  Blastocystis hominis --certainly can cause GI symptoms but can also be present and normal in healthy individuals; recent treatment with metronidazole --Will repeat ova and parasite stool test  40 minutes total spent today including patient facing time, coordination of care, reviewing medical  history/procedures/pertinent radiology studies, and documentation of the encounter.

## 2023-10-24 ENCOUNTER — Other Ambulatory Visit: Payer: 59

## 2023-10-24 ENCOUNTER — Telehealth: Payer: Self-pay

## 2023-10-24 NOTE — Telephone Encounter (Signed)
Informed patient to come in for repeat ova and parasite stool test to test for Blastocystis infection. Patient verbalized understanding. Patient will come by the lab today. Order placed and in comments stated we are testing for Blastocystis.

## 2023-10-24 NOTE — Addendum Note (Signed)
Addended by: Illene Bolus on: 10/24/2023 08:45 AM   Modules accepted: Orders

## 2023-10-24 NOTE — Telephone Encounter (Signed)
-----   Message from Carie Caddy Pyrtle sent at 10/23/2023  5:29 PM EDT ----- Please let her know she needs an ova and parasite stool test (please include in the remarks for the O&P exam to evaluate for Blastocystis infection) Thanks JMP

## 2023-10-31 ENCOUNTER — Other Ambulatory Visit: Payer: Self-pay | Admitting: Nurse Practitioner

## 2023-10-31 DIAGNOSIS — D509 Iron deficiency anemia, unspecified: Secondary | ICD-10-CM

## 2023-10-31 LAB — CELIAC DISEASE HLA DQ ASSOC.
DQ2 (DQA1 0501/0505,DQB1 02XX): NEGATIVE
DQ8 (DQA1 03XX, DQB1 0302): NEGATIVE

## 2023-11-03 ENCOUNTER — Other Ambulatory Visit: Payer: 59

## 2023-11-03 ENCOUNTER — Encounter: Payer: Self-pay | Admitting: Internal Medicine

## 2023-11-03 ENCOUNTER — Inpatient Hospital Stay: Payer: 59 | Attending: Hematology and Oncology

## 2023-11-03 DIAGNOSIS — D509 Iron deficiency anemia, unspecified: Secondary | ICD-10-CM | POA: Insufficient documentation

## 2023-11-03 DIAGNOSIS — A078 Other specified protozoal intestinal diseases: Secondary | ICD-10-CM

## 2023-11-03 LAB — CBC WITH DIFFERENTIAL (CANCER CENTER ONLY)
Abs Immature Granulocytes: 0.01 10*3/uL (ref 0.00–0.07)
Basophils Absolute: 0 10*3/uL (ref 0.0–0.1)
Basophils Relative: 1 %
Eosinophils Absolute: 0.1 10*3/uL (ref 0.0–0.5)
Eosinophils Relative: 2 %
HCT: 41.6 % (ref 36.0–46.0)
Hemoglobin: 13.9 g/dL (ref 12.0–15.0)
Immature Granulocytes: 0 %
Lymphocytes Relative: 22 %
Lymphs Abs: 1.1 10*3/uL (ref 0.7–4.0)
MCH: 32.2 pg (ref 26.0–34.0)
MCHC: 33.4 g/dL (ref 30.0–36.0)
MCV: 96.3 fL (ref 80.0–100.0)
Monocytes Absolute: 0.4 10*3/uL (ref 0.1–1.0)
Monocytes Relative: 8 %
Neutro Abs: 3.4 10*3/uL (ref 1.7–7.7)
Neutrophils Relative %: 67 %
Platelet Count: 260 10*3/uL (ref 150–400)
RBC: 4.32 MIL/uL (ref 3.87–5.11)
RDW: 12.4 % (ref 11.5–15.5)
WBC Count: 5.1 10*3/uL (ref 4.0–10.5)
nRBC: 0 % (ref 0.0–0.2)

## 2023-11-03 LAB — IRON AND IRON BINDING CAPACITY (CC-WL,HP ONLY)
Iron: 77 ug/dL (ref 28–170)
Saturation Ratios: 22 % (ref 10.4–31.8)
TIBC: 347 ug/dL (ref 250–450)
UIBC: 270 ug/dL (ref 148–442)

## 2023-11-03 LAB — FERRITIN: Ferritin: 22 ng/mL (ref 11–307)

## 2023-11-05 ENCOUNTER — Telehealth: Payer: Self-pay

## 2023-11-05 ENCOUNTER — Telehealth: Payer: Self-pay | Admitting: Hematology and Oncology

## 2023-11-05 NOTE — Telephone Encounter (Signed)
Pt advised with Vu and agreed to this plan of care

## 2023-11-05 NOTE — Telephone Encounter (Signed)
-----   Message from Pollyann Samples sent at 11/05/2023  2:16 PM EST ----- Please let pt know labs, CBC and iron/ferritin are normal, no need IV iron at this time. Please keep next lab visit.   Thanks, Clayborn Heron NP

## 2023-11-07 LAB — OVA AND PARASITE EXAMINATION
CONCENTRATE RESULT:: NONE SEEN
MICRO NUMBER:: 15681751
SPECIMEN QUALITY:: ADEQUATE
TRICHROME RESULT:: NONE SEEN

## 2023-12-15 ENCOUNTER — Inpatient Hospital Stay: Payer: 59 | Attending: Hematology and Oncology

## 2023-12-16 ENCOUNTER — Other Ambulatory Visit: Payer: Self-pay | Admitting: Medical Genetics

## 2023-12-16 ENCOUNTER — Inpatient Hospital Stay: Payer: 59 | Attending: Hematology and Oncology

## 2023-12-16 ENCOUNTER — Other Ambulatory Visit: Payer: Self-pay | Admitting: Hematology and Oncology

## 2023-12-16 DIAGNOSIS — D508 Other iron deficiency anemias: Secondary | ICD-10-CM

## 2023-12-16 DIAGNOSIS — D509 Iron deficiency anemia, unspecified: Secondary | ICD-10-CM | POA: Diagnosis present

## 2023-12-16 LAB — CBC WITH DIFFERENTIAL (CANCER CENTER ONLY)
Abs Immature Granulocytes: 0.01 K/uL (ref 0.00–0.07)
Basophils Absolute: 0 K/uL (ref 0.0–0.1)
Basophils Relative: 1 %
Eosinophils Absolute: 0.1 K/uL (ref 0.0–0.5)
Eosinophils Relative: 2 %
HCT: 41.5 % (ref 36.0–46.0)
Hemoglobin: 13.8 g/dL (ref 12.0–15.0)
Immature Granulocytes: 0 %
Lymphocytes Relative: 28 %
Lymphs Abs: 1.4 K/uL (ref 0.7–4.0)
MCH: 31.6 pg (ref 26.0–34.0)
MCHC: 33.3 g/dL (ref 30.0–36.0)
MCV: 95 fL (ref 80.0–100.0)
Monocytes Absolute: 0.4 K/uL (ref 0.1–1.0)
Monocytes Relative: 8 %
Neutro Abs: 3.1 K/uL (ref 1.7–7.7)
Neutrophils Relative %: 61 %
Platelet Count: 226 K/uL (ref 150–400)
RBC: 4.37 MIL/uL (ref 3.87–5.11)
RDW: 12.1 % (ref 11.5–15.5)
WBC Count: 5 K/uL (ref 4.0–10.5)
nRBC: 0 % (ref 0.0–0.2)

## 2023-12-16 LAB — RETIC PANEL
Immature Retic Fract: 6.9 % (ref 2.3–15.9)
RBC.: 4.42 MIL/uL (ref 3.87–5.11)
Retic Count, Absolute: 29.2 10*3/uL (ref 19.0–186.0)
Retic Ct Pct: 0.7 % (ref 0.4–3.1)
Reticulocyte Hemoglobin: 35.2 pg (ref >27.9)

## 2023-12-16 LAB — CMP (CANCER CENTER ONLY)
ALT: 21 U/L (ref 0–44)
AST: 27 U/L (ref 15–41)
Albumin: 4.7 g/dL (ref 3.5–5.0)
Alkaline Phosphatase: 46 U/L (ref 38–126)
Anion gap: 6 (ref 5–15)
BUN: 13 mg/dL (ref 6–20)
CO2: 29 mmol/L (ref 22–32)
Calcium: 9.6 mg/dL (ref 8.9–10.3)
Chloride: 105 mmol/L (ref 98–111)
Creatinine: 0.61 mg/dL (ref 0.44–1.00)
GFR, Estimated: >60 mL/min (ref >60)
Glucose, Bld: 83 mg/dL (ref 70–99)
Potassium: 4 mmol/L (ref 3.5–5.1)
Sodium: 140 mmol/L (ref 135–145)
Total Bilirubin: 0.5 mg/dL (ref <1.2)
Total Protein: 6.9 g/dL (ref 6.5–8.1)

## 2023-12-16 LAB — IRON AND IRON BINDING CAPACITY (CC-WL,HP ONLY)
Iron: 134 ug/dL (ref 28–170)
Saturation Ratios: 34 % — ABNORMAL HIGH (ref 10.4–31.8)
TIBC: 392 ug/dL (ref 250–450)
UIBC: 258 ug/dL (ref 148–442)

## 2023-12-16 LAB — FERRITIN: Ferritin: 24 ng/mL (ref 11–307)

## 2023-12-17 ENCOUNTER — Other Ambulatory Visit: Payer: Self-pay | Admitting: Hematology and Oncology

## 2023-12-18 ENCOUNTER — Encounter: Payer: Self-pay | Admitting: Hematology and Oncology

## 2023-12-19 ENCOUNTER — Inpatient Hospital Stay: Payer: 59

## 2023-12-19 VITALS — BP 119/56 | HR 72 | Resp 14

## 2023-12-19 DIAGNOSIS — D508 Other iron deficiency anemias: Secondary | ICD-10-CM

## 2023-12-19 DIAGNOSIS — D509 Iron deficiency anemia, unspecified: Secondary | ICD-10-CM | POA: Diagnosis not present

## 2023-12-19 MED ORDER — SODIUM CHLORIDE 0.9 % IV SOLN: INTRAVENOUS | Status: DC

## 2023-12-19 MED ORDER — SODIUM CHLORIDE 0.9 % IV SOLN
1000.0000 mg | Freq: Once | INTRAVENOUS | Status: AC
  Administered 2023-12-19: 1000 mg via INTRAVENOUS
  Filled 2023-12-19: qty 10

## 2024-01-06 ENCOUNTER — Ambulatory Visit: Payer: 59 | Admitting: Internal Medicine

## 2024-01-08 ENCOUNTER — Ambulatory Visit (INDEPENDENT_AMBULATORY_CARE_PROVIDER_SITE_OTHER): Payer: 59 | Admitting: Sports Medicine

## 2024-01-08 VITALS — BP 120/69 | Ht 66.0 in | Wt 110.0 lb

## 2024-01-08 DIAGNOSIS — R269 Unspecified abnormalities of gait and mobility: Secondary | ICD-10-CM | POA: Diagnosis not present

## 2024-01-08 DIAGNOSIS — D509 Iron deficiency anemia, unspecified: Secondary | ICD-10-CM

## 2024-01-08 DIAGNOSIS — M8430XS Stress fracture, unspecified site, sequela: Secondary | ICD-10-CM | POA: Diagnosis not present

## 2024-01-08 NOTE — Assessment & Plan Note (Addendum)
 This is quite a complex and perplexing case. Her previous stress fractures are most certainly contributed by her significant iron  deficiency. I have reviewed her ferritin and CBCs over the past 12 months as well as her hematology notes and it appears the etiology of her iron  deficiency is still elusive. Does not appear to be related to blood loss given normal EGD/colonoscopy and normal menses. Possible this could be related to recent H.pylori colonization, though has completed treatment for this. Interesting how she depletes iron  stores even with regular IV repletion. Will do some additional research into this and see if there are some additional labs we can pursue to aid in figuring out the etiology. Ferritin goal for athletes at risk for stress injuries is 50-60.

## 2024-01-08 NOTE — Progress Notes (Signed)
 PCP: Randol Dawes, MD  SUBJECTIVE:   HPI:  Patient is a 43 y.o. 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   She is a runner and hopes to be able to return to marathon training, especially with upcoming race in [place] this April. She has followed with [facility], specifically Rhoda Babe MD for the last few years for management of her fractures. She reports a history of 10 fractures in the past 8 years. Fractures have included midshaft femur, femoral neck, cuneiform, cuboids, pubic ramus, and multiple metatarsals. Most recent fractures were this past fall. She has recovered from these, however is looking for guidance returning back to running to avoid future fractures. Currently she is limiting her running to less than 40 mi/week. She also does a fair bit of elliptical use, though not much bike cross-training.  She has dealt with low iron  for quite some time. She is followed by Hematology and is getting serial iron  infusions. Extensive workup has been negative for identifiable source of blood loss including negative EGD and colonoscopy. Did recently complete treatment for H.pylori infection. She reports she has always had normal menstrual periods. She has not been able to bring her ferritin levels up with PO iron . She has tried multiple IV iron  infusion formulations, though reports she has only had success bringing her levels up with IV monoferric  infusions. Her ferritin will fall soon after these infusions, however her hemoglobin has remained fairly stable. She is aware an appropriate ferritin goal for her is 50-60. She has correlated her fractures to times in which she had ferritin levels < 40, as low as 2. She is currently trying to supplement her iron  with Ancestral Beef Liver Supplementation.  She has had reported normal bone density testing with Dr. Babe though these records  are not available to me. She has been following with a dietician since 03/2023 working to try to optimize her caloric and nutritional intake. She has had some success with this as her weight is up as high as 10lbs to 113#, though currently at 110#. She thinks she is at about 90g of protein daily and is getting multiple sources of carbs daily. She had previously been vegan, though has started to incorporate animal protein into her diet over the past year. She was gluten free during part of her GI workup though is currently back to eating gluten.  ROS:     See HPI  PERTINENT  PMH / PSH / FH / SH:  Past Medical, Surgical, Social, and Family History Reviewed & Updated in the EMR.  Pertinent findings include:  See HPI  Allergies  Allergen Reactions   Benzoyl Peroxide Other (See Comments)   Cephalexin Swelling   Codeine    Darvocet A500 [Propoxyphene N-Acetaminophen ]    Dilaudid [Hydromorphone Hcl]    Hydrocodone-Acetaminophen     Latex    Macrobid [Nitrofurantoin Macrocrystal]    Morphine Sulfate    Propoxyphene Other (See Comments)   Sulfa Antibiotics    Tape Rash and Swelling    OBJECTIVE:  BP 120/69   Ht 5' 6 (1.676 m)   Wt 110 lb (49.9 kg)   BMI 17.75 kg/m   PHYSICAL EXAM:  GEN: Thin athletic female. A&Ox3, NAD, comfortable. RESP: Unlabored respirations, symmetric chest rise PSY: normal mood, congruent affect   MSK EXAM: Good hip abductor and flexor strength bilaterally with appropriate SI joint motion. Leg lengths equal.  Neutral foot position.  Her shoes have significant imbalance of forefoot wearing on the lateral aspects much more than the medial aspect.  Running gait analysis: purely forefoot striker. Lands with forefoot varus in supination. Rapid alternation between foot pronation and supination. She has an exaggerated rebound supination in compensation after pushoff. Less forefoot pronation/supination alternation with wide forefoot Altras compared to her [place] --  particularly after we add a scaphoid pad and a heel lift to give some drop from heel to toe.   Assessment & Plan Iron  deficiency anemia, unspecified iron  deficiency anemia type This is quite a complex and perplexing case. Her previous stress fractures are most certainly contributed by her significant iron  deficiency. I have reviewed her ferritin and CBCs over the past 12 months as well as her hematology notes and it appears the etiology of her iron  deficiency is still elusive. Does not appear to be related to blood loss given normal EGD/colonoscopy and normal menses. Possible this could be related to recent H.pylori colonization, though has completed treatment for this. Interesting how she depletes iron  stores even with regular IV repletion. Will do some additional research into this and see if there are some additional labs we can pursue to aid in figuring out the etiology. Ferritin goal for athletes at risk for stress injuries is 50-60. Stress fracture, unspecified site, sequela 10 stress fractures over past 8 years, see HPI for details. Suspect her stress fractures have been primarily driven by her nutritional deficits/low ferritin, though definitely some biomechanical risk with her gait. She meets several criteria for RED-S, however interestingly has never been amenorrheic. Encouraged her to continue working with RD and maintaining a minimum goal of 2g protein/kg body weight daily, which for her would be 100+g protein daily. Continue PO iron  supplementation. Advised limiting mileage to < 40/week for now. She will keep a diet and exercise journal over the next 2 weeks and message Dr. Harvey this report prior to follow-up. F/u in 2-3 weeks. Abnormality of gait Forefoot strike runner as noted above. Recommend she use her Altras with added corrections to her insoles as the wide toe box limits some of the rapid alternation of pronation/supination and neutralizes some of her running biomechanical risk for  fractures. Did provider her some scaphoid pads and 3/16th in heel lifts which did also correct some of her forefoot varus landing and rapid alternation of the pronation/supination. She noted comfort with these.    Prentice Niece, MD PGY-4, Sports Medicine Fellow [facility]  I observed and examined the patient with the [facility] resident and agree with assessment and plan.  Note reviewed and modified by me. She does have biomechanical factors that we need to correct. This is a particularly challenging case. I have to believe that she is likely insufficient in calories as well as the balance of CHO and Protein to fit her heavy level of exercise. (REDS) However, I am concerned that she may have some issue with iron  transport or incorporation that is rare and limiting her ability to maintain iron  stores.  This may be leading to inadequate muscular recovery from inability to incorporate adequate iron  for myoglobin and muscle cell turn over.  We will do more assessment of her diet quantity and composition as well as considering specialized iron  metabolism testing.  PATRICE Harvey, MD

## 2024-01-15 ENCOUNTER — Telehealth: Payer: Self-pay | Admitting: Hematology and Oncology

## 2024-01-26 ENCOUNTER — Other Ambulatory Visit: Payer: Self-pay | Admitting: Hematology and Oncology

## 2024-01-26 DIAGNOSIS — D509 Iron deficiency anemia, unspecified: Secondary | ICD-10-CM

## 2024-01-27 ENCOUNTER — Inpatient Hospital Stay: Payer: 59 | Attending: Hematology and Oncology

## 2024-01-27 DIAGNOSIS — D500 Iron deficiency anemia secondary to blood loss (chronic): Secondary | ICD-10-CM | POA: Diagnosis present

## 2024-01-27 DIAGNOSIS — N920 Excessive and frequent menstruation with regular cycle: Secondary | ICD-10-CM | POA: Diagnosis not present

## 2024-01-27 DIAGNOSIS — D509 Iron deficiency anemia, unspecified: Secondary | ICD-10-CM

## 2024-01-27 LAB — CMP (CANCER CENTER ONLY)
ALT: 31 U/L (ref 0–44)
AST: 32 U/L (ref 15–41)
Albumin: 5 g/dL (ref 3.5–5.0)
Alkaline Phosphatase: 53 U/L (ref 38–126)
Anion gap: 6 (ref 5–15)
BUN: 17 mg/dL (ref 6–20)
CO2: 28 mmol/L (ref 22–32)
Calcium: 10.2 mg/dL (ref 8.9–10.3)
Chloride: 103 mmol/L (ref 98–111)
Creatinine: 0.71 mg/dL (ref 0.44–1.00)
GFR, Estimated: >60 mL/min (ref >60)
Glucose, Bld: 110 mg/dL — ABNORMAL HIGH (ref 70–99)
Potassium: 4.2 mmol/L (ref 3.5–5.1)
Sodium: 137 mmol/L (ref 135–145)
Total Bilirubin: 0.5 mg/dL (ref 0.0–1.2)
Total Protein: 7.5 g/dL (ref 6.5–8.1)

## 2024-01-27 LAB — CBC WITH DIFFERENTIAL (CANCER CENTER ONLY)
Abs Immature Granulocytes: 0.01 10*3/uL (ref 0.00–0.07)
Basophils Absolute: 0 10*3/uL (ref 0.0–0.1)
Basophils Relative: 1 %
Eosinophils Absolute: 0.1 10*3/uL (ref 0.0–0.5)
Eosinophils Relative: 1 %
HCT: 41.6 % (ref 36.0–46.0)
Hemoglobin: 13.8 g/dL (ref 12.0–15.0)
Immature Granulocytes: 0 %
Lymphocytes Relative: 23 %
Lymphs Abs: 1.2 10*3/uL (ref 0.7–4.0)
MCH: 31.8 pg (ref 26.0–34.0)
MCHC: 33.2 g/dL (ref 30.0–36.0)
MCV: 95.9 fL (ref 80.0–100.0)
Monocytes Absolute: 0.3 10*3/uL (ref 0.1–1.0)
Monocytes Relative: 7 %
Neutro Abs: 3.4 10*3/uL (ref 1.7–7.7)
Neutrophils Relative %: 68 %
Platelet Count: 229 10*3/uL (ref 150–400)
RBC: 4.34 MIL/uL (ref 3.87–5.11)
RDW: 12.9 % (ref 11.5–15.5)
WBC Count: 5 10*3/uL (ref 4.0–10.5)
nRBC: 0 % (ref 0.0–0.2)

## 2024-01-27 LAB — RETIC PANEL
Immature Retic Fract: 5.8 % (ref 2.3–15.9)
RBC.: 4.3 MIL/uL (ref 3.87–5.11)
Retic Count, Absolute: 47.7 10*3/uL (ref 19.0–186.0)
Retic Ct Pct: 1.1 % (ref 0.4–3.1)
Reticulocyte Hemoglobin: 34.6 pg (ref >27.9)

## 2024-01-27 LAB — IRON AND IRON BINDING CAPACITY (CC-WL,HP ONLY)
Iron: 124 ug/dL (ref 28–170)
Saturation Ratios: 36 % — ABNORMAL HIGH (ref 10.4–31.8)
TIBC: 340 ug/dL (ref 250–450)
UIBC: 216 ug/dL (ref 148–442)

## 2024-01-27 LAB — FERRITIN: Ferritin: 118 ng/mL (ref 11–307)

## 2024-02-02 ENCOUNTER — Telehealth: Payer: Self-pay | Admitting: Hematology and Oncology

## 2024-02-02 ENCOUNTER — Telehealth: Payer: Self-pay | Admitting: *Deleted

## 2024-02-02 NOTE — Telephone Encounter (Signed)
Scheduled appointments per 2/3 scheduling message. Left patient a voicemail with appointment dates and times. Patient will be mailed appointment reminders.

## 2024-02-02 NOTE — Telephone Encounter (Signed)
-----   Message from Ulysees Barns IV sent at 01/28/2024  4:56 PM EST ----- Please let Ms. Rosenburg know that her ferritin levels are excellent at 118.  I would recommend that we collect labs in 3 months time and again at 6 months time.  If she would like a different schedule for lab collections I would be agreeable. ----- Message ----- From: Leory Plowman, Lab In [place] Sent: 01/27/2024   8:18 AM EST To: Jaci Standard, MD

## 2024-02-02 NOTE — Telephone Encounter (Signed)
[name] patient regarding recent lab results. No answer but was able to leave vm message on her identified vm. Advised that her ferritin levels are excellent at 118. I would recommend that we collect labs in 3 months time and again at 6 months time. If she would like a different schedule for lab collections we can arrange that. Advised to call back to [phone] with any questions or concerns.

## 2024-03-04 ENCOUNTER — Ambulatory Visit: Payer: 59 | Admitting: Sports Medicine

## 2024-03-04 VITALS — BP 122/70 | Ht 66.0 in | Wt 107.0 lb

## 2024-03-04 DIAGNOSIS — R269 Unspecified abnormalities of gait and mobility: Secondary | ICD-10-CM | POA: Diagnosis not present

## 2024-03-04 DIAGNOSIS — T733XXA Exhaustion due to excessive exertion, initial encounter: Secondary | ICD-10-CM

## 2024-03-04 DIAGNOSIS — M25552 Pain in left hip: Secondary | ICD-10-CM

## 2024-03-04 DIAGNOSIS — D509 Iron deficiency anemia, unspecified: Secondary | ICD-10-CM

## 2024-03-04 NOTE — Assessment & Plan Note (Signed)
 Follows with hematology for this getting IV infusions as she has not had good response to PO iron supplementation. Raises suspicion for chronically elevated Hepcidin levels from over training. She has discussed with her hematologist the possibility of having this checked, we will look into where this may be feasibly accomplished. Also follows w/ GI and reports recent H.pylori and Blastocystis infection. Though I am not aware of how reliable the GI Map is in detecting h.pylori infections. Encouraged discussing this with GI, as if she is truly infected with H.pylori that could certainly be contributing to her iron deficiency with chronic inflammatory changes, though her endoscopies were negative on biopsy.

## 2024-03-04 NOTE — Progress Notes (Addendum)
 PCP: [name], MD  SUBJECTIVE:   HPI:  Patient is a 43 y.o. female here for follow-up on iron deficiency/stress fxs/over-training syndrome. See initial note from 12/2023 for back story on stress fractures.  Training for marathon in late April. Current weekly mileage up above our last recommendation of < 41mi/wk, she's at about 106mi/wk. Has done well with the scaphoid pads and 3/6" heel lifts. Has started having some discomfort after runs in her left buttock that feels similar to the sensation she had before her right pubic ramus injury. No groin pain. Running without pain and denies loss of ROM.  She reports she has been able to meet protein goal of 90-100g / day. She is getting full quickly and having difficulty with CHO intake, thinks current daily total cal intake is about 1600 on good days. States this is much improved from even a year ago when she was at 800/day. Continues to follow with RD. Weight down 3# sine January.  Her ferritin was improved on most recent check up to 118 (01/17/24) after the December IV iron infusion. Thinks she is lower than that currently based on how she is feeling. Has stopped the OTC Beef Liver Supplement.  Re: the H.pylori infection. 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 Did not have triple or quadruple treatment and no test of clearance for H.pylori.  Pertinent ROS were reviewed with the patient and found to be negative unless otherwise specified above in HPI.   PERTINENT  PMH / PSH / FH / SH:  Past Medical, Surgical, Social, and Family History Reviewed & Updated in the EMR.  Pertinent findings include:  11 stress fractures in the past 9 years (midshaft femur, femoral neck, cuneiform, cuboids, pubic ramus, and multiple metatarsals) Iron Deficiency Anemia RED-S  Allergies  Allergen Reactions   Benzoyl Peroxide Other (See Comments)    Cephalexin Swelling   Codeine    Darvocet A500 [Propoxyphene N-Acetaminophen]    Dilaudid [Hydromorphone Hcl]    Hydrocodone-Acetaminophen    Latex    Macrobid [Nitrofurantoin Macrocrystal]    Morphine Sulfate    Propoxyphene Other (See Comments)   Sulfa Antibiotics    Tape Rash and Swelling   OBJECTIVE:  BP 122/70   Ht 5\' 6"  (1.676 m)   Wt 107 lb (48.5 kg)   BMI 17.27 kg/m   PHYSICAL EXAM:  GEN: Alert and Oriented, NAD, comfortable in exam room RESP: Unlabored respirations, symmetric chest rise PSY: normal mood, congruent affect   LEFT HIP MSK EXAM: No swelling or deformity. FROM, painless. Negative FABER/FADIR. Negative [place]. Negative hop-test. Full strength on flexion, abduction, adduction, extension. Good SI mobility. No pain with pressure on ischial tuberosity or pubic ramus.  Running gait: Forefoot strike. Improved forefoot varus and supination on landing with shoe modifications. Assessment & Plan Athletic overtraining syndrome She definitely is over-training and under fueling fitting with RED-S picture. This is a large risk factor for her recurrent stress injuries. Long discussion around increasing her daily caloric intake to a minimum of 1400 + additional 100 for every mile she trains. Goal of 60% of calories to be CHO while preserving protein goal of 90-100g/d (2g/kg body weight). Encouraged continued efforts getting creative with how to attain these goals, working with her RD. Pain of left hip Largely reassuring exam today, though with her pain presenting similar to her previous pubic ramus stress fx  It is possible some early stress reaction is present today. Encouraged trying to aim for lower impact training with treadmill running and use of zero gravity treadmill which she reports having access to up in [place]. Plan to follow-up in 4-6 weeks, before upcoming marathon in April or sooner if issues arise. Consider imaging if pain worsening. Abnormality of  gait Mechanics of running improved with scaphoid padding and 3/16" heel lifts. She has already worn down the pads placed in her shoes 2 months ago. These were updated in her new Altras today and provided [employer] catalog so she can purchase/update these every few months. Iron deficiency anemia, unspecified iron deficiency anemia type Follows with hematology for this getting IV infusions as she has not had good response to PO iron supplementation. Raises suspicion for chronically elevated Hepcidin levels from over training. She has discussed with her hematologist the possibility of having this checked, we will look into where this may be feasibly accomplished. Also follows w/ GI and reports recent H.pylori and Blastocystis infection. Though I am not aware of how reliable the GI Map is in detecting h.pylori infections. Encouraged discussing this with GI, as if she is truly infected with H.pylori that could certainly be contributing to her iron deficiency with chronic inflammatory changes, though her endoscopies were negative on biopsy.  Greater than 30 minutes was spent face to face with the patient obtaining history, performing appropriate physical exam, reviewing recent GI and Hematology notes and labs, as well as generating a treatment plan with the patient.  Glean Salen, MD PGY-4, Sports Medicine Fellow [facility]  I observed and examined the patient with the [facility] resident and agree with assessment and plan.  Note reviewed and modified by me.  I will also look into any studies  where  we can do more testing of her iron kinetics. Sterling Big, MD

## 2024-03-14 ENCOUNTER — Encounter: Payer: Self-pay | Admitting: Hematology and Oncology

## 2024-03-18 ENCOUNTER — Other Ambulatory Visit: Payer: Self-pay | Admitting: *Deleted

## 2024-03-18 ENCOUNTER — Telehealth: Payer: Self-pay | Admitting: Hematology and Oncology

## 2024-03-18 DIAGNOSIS — D509 Iron deficiency anemia, unspecified: Secondary | ICD-10-CM

## 2024-03-22 ENCOUNTER — Other Ambulatory Visit

## 2024-03-23 ENCOUNTER — Encounter: Payer: Self-pay | Admitting: Hematology and Oncology

## 2024-03-23 ENCOUNTER — Inpatient Hospital Stay: Attending: Hematology and Oncology

## 2024-03-23 DIAGNOSIS — D508 Other iron deficiency anemias: Secondary | ICD-10-CM | POA: Insufficient documentation

## 2024-03-23 DIAGNOSIS — D500 Iron deficiency anemia secondary to blood loss (chronic): Secondary | ICD-10-CM | POA: Insufficient documentation

## 2024-03-23 DIAGNOSIS — N920 Excessive and frequent menstruation with regular cycle: Secondary | ICD-10-CM | POA: Diagnosis not present

## 2024-03-23 DIAGNOSIS — D509 Iron deficiency anemia, unspecified: Secondary | ICD-10-CM

## 2024-03-23 LAB — CBC WITH DIFFERENTIAL (CANCER CENTER ONLY)
Abs Immature Granulocytes: 0.01 10*3/uL (ref 0.00–0.07)
Basophils Absolute: 0 10*3/uL (ref 0.0–0.1)
Basophils Relative: 1 %
Eosinophils Absolute: 0.2 10*3/uL (ref 0.0–0.5)
Eosinophils Relative: 3 %
HCT: 41.8 % (ref 36.0–46.0)
Hemoglobin: 14.1 g/dL (ref 12.0–15.0)
Immature Granulocytes: 0 %
Lymphocytes Relative: 25 %
Lymphs Abs: 1.4 10*3/uL (ref 0.7–4.0)
MCH: 32.3 pg (ref 26.0–34.0)
MCHC: 33.7 g/dL (ref 30.0–36.0)
MCV: 95.7 fL (ref 80.0–100.0)
Monocytes Absolute: 0.4 10*3/uL (ref 0.1–1.0)
Monocytes Relative: 6 %
Neutro Abs: 3.7 10*3/uL (ref 1.7–7.7)
Neutrophils Relative %: 65 %
Platelet Count: 213 10*3/uL (ref 150–400)
RBC: 4.37 MIL/uL (ref 3.87–5.11)
RDW: 12.5 % (ref 11.5–15.5)
WBC Count: 5.7 10*3/uL (ref 4.0–10.5)
nRBC: 0 % (ref 0.0–0.2)

## 2024-03-23 LAB — IRON AND IRON BINDING CAPACITY (CC-WL,HP ONLY)
Iron: 128 ug/dL (ref 28–170)
Saturation Ratios: 35 % — ABNORMAL HIGH (ref 10.4–31.8)
TIBC: 361 ug/dL (ref 250–450)
UIBC: 233 ug/dL (ref 148–442)

## 2024-03-23 LAB — FERRITIN: Ferritin: 62 ng/mL (ref 11–307)

## 2024-03-24 ENCOUNTER — Telehealth: Payer: Self-pay | Admitting: *Deleted

## 2024-03-24 NOTE — Telephone Encounter (Signed)
-----   Message from Ulysees Barns IV sent at 03/23/2024  2:31 PM EDT ----- Please let Mrs. Shoults know that her iron levels are excellent, currently on target above our goals. ----- Message ----- From: Leory Plowman, Lab In [place] Sent: 03/23/2024   8:37 AM EDT To: Jaci Standard, MD

## 2024-03-24 NOTE — Telephone Encounter (Signed)
 [name] patient regarding recent lab results. Spoke with her. Advised  that her iron levels are excellent, currently on target above our goals. Pt voiced understanding. She is aware of her appt in May 2025

## 2024-03-25 ENCOUNTER — Encounter: Payer: Self-pay | Admitting: Hematology and Oncology

## 2024-03-30 ENCOUNTER — Ambulatory Visit (INDEPENDENT_AMBULATORY_CARE_PROVIDER_SITE_OTHER): Admitting: Sports Medicine

## 2024-03-30 VITALS — BP 110/72 | Ht 66.0 in | Wt 106.0 lb

## 2024-03-30 DIAGNOSIS — F509 Eating disorder, unspecified: Secondary | ICD-10-CM | POA: Insufficient documentation

## 2024-03-30 DIAGNOSIS — F5089 Other specified eating disorder: Secondary | ICD-10-CM

## 2024-03-30 DIAGNOSIS — D509 Iron deficiency anemia, unspecified: Secondary | ICD-10-CM | POA: Diagnosis not present

## 2024-03-30 NOTE — Progress Notes (Signed)
 Patient returns for follow-up of REDs as she is preparing for marathon  Training has gone fairly well since her last visit and she has done several 20 mile runs She does get fatigue after these but not severe The left hip pain that she had some of on last visit has pretty much resolved No significant bony pain at this time She is getting about 80 g of protein per day She is probably getting less than 200 g carbohydrates per day and should be getting close to 400 with her training and resting metabolic rate  She had iron infusion about 6 weeks ago but her ferritin has dropped faster from about 110 down to 60.  Her hemoglobin is actually going up to 14  She is not having the symptoms she gets when the ferritin is very low of the ringing in her ears  Physical exam Athletic female thin in no acute distress BP 110/72   Ht 5\' 6"  (1.676 m)   Wt 106 lb (48.1 kg)   BMI 17.11 kg/m   Evaluation of running form shows that is improved with the use of Scaphoid pad and sports insoles Strength testing shows excellent hip abduction strength Excellent quad strength Hamstring strength is good but at maximum knee flexion she has some weakness that I suspect is from fatigue

## 2024-03-30 NOTE — Assessment & Plan Note (Signed)
 See above comments  Increased carbohydrate intake to try to add at least 100 g of carbohydrate a day Keep up with protein intake to be at least 80 g/day Supplement good salt intake as she is a heavy sweater  We will follow-up to see if she is improving with the strategies

## 2024-03-31 ENCOUNTER — Telehealth: Payer: Self-pay | Admitting: *Deleted

## 2024-03-31 NOTE — Telephone Encounter (Signed)
 Referral fax'd to Wynona Canes, PA-C as [facility] Department of hematology per pt's request. Pt made aware of referral being sent.

## 2024-04-30 ENCOUNTER — Other Ambulatory Visit: Payer: Self-pay | Admitting: Hematology and Oncology

## 2024-04-30 ENCOUNTER — Inpatient Hospital Stay: Payer: 59 | Attending: Hematology and Oncology

## 2024-04-30 DIAGNOSIS — D509 Iron deficiency anemia, unspecified: Secondary | ICD-10-CM | POA: Insufficient documentation

## 2024-04-30 DIAGNOSIS — D500 Iron deficiency anemia secondary to blood loss (chronic): Secondary | ICD-10-CM | POA: Diagnosis present

## 2024-04-30 LAB — CBC WITH DIFFERENTIAL (CANCER CENTER ONLY)
Abs Immature Granulocytes: 0.02 10*3/uL (ref 0.00–0.07)
Basophils Absolute: 0.1 10*3/uL (ref 0.0–0.1)
Basophils Relative: 1 %
Eosinophils Absolute: 0.1 10*3/uL (ref 0.0–0.5)
Eosinophils Relative: 3 %
HCT: 39.2 % (ref 36.0–46.0)
Hemoglobin: 13.3 g/dL (ref 12.0–15.0)
Immature Granulocytes: 0 %
Lymphocytes Relative: 29 %
Lymphs Abs: 1.6 10*3/uL (ref 0.7–4.0)
MCH: 31.6 pg (ref 26.0–34.0)
MCHC: 33.9 g/dL (ref 30.0–36.0)
MCV: 93.1 fL (ref 80.0–100.0)
Monocytes Absolute: 0.4 10*3/uL (ref 0.1–1.0)
Monocytes Relative: 7 %
Neutro Abs: 3.3 10*3/uL (ref 1.7–7.7)
Neutrophils Relative %: 60 %
Platelet Count: 251 10*3/uL (ref 150–400)
RBC: 4.21 MIL/uL (ref 3.87–5.11)
RDW: 12 % (ref 11.5–15.5)
WBC Count: 5.5 10*3/uL (ref 4.0–10.5)
nRBC: 0 % (ref 0.0–0.2)

## 2024-04-30 LAB — CMP (CANCER CENTER ONLY)
ALT: 29 U/L (ref 0–44)
AST: 26 U/L (ref 15–41)
Albumin: 4.8 g/dL (ref 3.5–5.0)
Alkaline Phosphatase: 48 U/L (ref 38–126)
Anion gap: 5 (ref 5–15)
BUN: 15 mg/dL (ref 6–20)
CO2: 31 mmol/L (ref 22–32)
Calcium: 9.9 mg/dL (ref 8.9–10.3)
Chloride: 105 mmol/L (ref 98–111)
Creatinine: 0.66 mg/dL (ref 0.44–1.00)
GFR, Estimated: >60 mL/min (ref >60)
Glucose, Bld: 96 mg/dL (ref 70–99)
Potassium: 3.8 mmol/L (ref 3.5–5.1)
Sodium: 141 mmol/L (ref 135–145)
Total Bilirubin: 0.3 mg/dL (ref 0.0–1.2)
Total Protein: 7.2 g/dL (ref 6.5–8.1)

## 2024-04-30 LAB — RETIC PANEL
Immature Retic Fract: 10.4 % (ref 2.3–15.9)
RBC.: 4.23 MIL/uL (ref 3.87–5.11)
Retic Count, Absolute: 45.3 10*3/uL (ref 19.0–186.0)
Retic Ct Pct: 1.1 % (ref 0.4–3.1)
Reticulocyte Hemoglobin: 34.7 pg (ref >27.9)

## 2024-04-30 LAB — IRON AND IRON BINDING CAPACITY (CC-WL,HP ONLY)
Iron: 63 ug/dL (ref 28–170)
Saturation Ratios: 18 % (ref 10.4–31.8)
TIBC: 346 ug/dL (ref 250–450)
UIBC: 283 ug/dL (ref 148–442)

## 2024-04-30 LAB — FERRITIN: Ferritin: 62 ng/mL (ref 11–307)

## 2024-05-05 ENCOUNTER — Telehealth: Payer: Self-pay | Admitting: *Deleted

## 2024-05-05 NOTE — Telephone Encounter (Signed)
-----   Message from Rogerio Clay IV sent at 04/30/2024  3:07 PM EDT ----- Please relay these labs to Mrs. Stauder. Iron  level appear stable, no indication for IV iron  therapy. ----- Message ----- From: Interface, Lab In [place] Sent: 04/30/2024  11:13 AM EDT To: Ander Bame, MD

## 2024-05-05 NOTE — Telephone Encounter (Signed)
 [name] patient regarding lab results.  No answer.  Able to leave detailed message for her regarding her labs. Advised that her iron  studies and HGB are stable, no indication for IV iron  at this time. Advised to call back to [phone] if she has any questions or concerns.

## 2024-07-15 ENCOUNTER — Telehealth: Admitting: Family Medicine

## 2024-07-15 DIAGNOSIS — R609 Edema, unspecified: Secondary | ICD-10-CM

## 2024-07-15 DIAGNOSIS — L539 Erythematous condition, unspecified: Secondary | ICD-10-CM | POA: Diagnosis not present

## 2024-07-15 DIAGNOSIS — T63461A Toxic effect of venom of wasps, accidental (unintentional), initial encounter: Secondary | ICD-10-CM

## 2024-07-15 MED ORDER — AMOXICILLIN-POT CLAVULANATE 875-125 MG PO TABS: 1.0000 | ORAL_TABLET | Freq: Two times a day (BID) | ORAL | 0 refills | Status: AC

## 2024-07-15 MED ORDER — PREDNISONE 10 MG (21) PO TBPK: ORAL_TABLET | ORAL | 0 refills | Status: AC

## 2024-07-15 NOTE — Patient Instructions (Addendum)
 Amanda [name], thank you for joining Amanda CHRISTELLA Barefoot, NP for today's virtual visit.  While this provider is not your primary care provider (PCP), if your PCP is located in our provider database this encounter information will be shared with them immediately following your visit.   A [facility] MyChart account gives you access to today's visit and all your visits, tests, and labs performed at [facility]  click here if you don't have a [facility] MyChart account or go to [email or web address]  Consent: (Patient) Amanda [name] provided verbal consent for this virtual visit at the beginning of the encounter.  Current Medications:  Current Outpatient Medications:    amoxicillin -clavulanate (AUGMENTIN ) 875-125 MG tablet, Take 1 tablet by mouth 2 (two) times daily for 7 days., Disp: 14 tablet, Rfl: 0   predniSONE  (STERAPRED UNI-PAK 21 TAB) 10 MG (21) TBPK tablet, Take as directed, Disp: 21 tablet, Rfl: 0   AMBULATORY NON FORMULARY MEDICATION, Floradix liquid Iron  Take 10 ml by mouth twice a day, Disp: , Rfl:    Medications ordered in this encounter:  Meds ordered this encounter  Medications   predniSONE  (STERAPRED UNI-PAK 21 TAB) 10 MG (21) TBPK tablet    Sig: Take as directed    Dispense:  21 tablet    Refill:  0    Supervising Provider:   LAMPTEY, PHILIP O [[id number]]   amoxicillin -clavulanate (AUGMENTIN ) 875-125 MG tablet    Sig: Take 1 tablet by mouth 2 (two) times daily for 7 days.    Dispense:  14 tablet    Refill:  0    Supervising Provider:   BLAISE ALEENE KIDD [[id number]]     *If you need refills on other medications prior to your next appointment, please contact your pharmacy*  Follow-Up: Call back or seek an in-person evaluation if the symptoms worsen or if the condition fails to improve as anticipated.  [facility] [phone]  Other Instructions  - Discussion on need of antibiotic versus just use of prednisone .  With known history of  having cellulitis in the past will have a prescription for Augmentin  on hand to start in 24-48 hrs. if prednisone  is not improving symptoms  -Ice/cold compresses -Topical and/or oral Benadryl  as needed though prednisone  should help with itching and redness and inflammation -Hydrocortisone if needed -Seek further evaluation if signs of infection do develop and Augmentin  does not improve  -Reports that she is going on a 15 mile run tomorrow.  Advised that yes this could make the injury worse given the location of the sting.  Hopefully the prednisone  being in her system will prevent any excessive swelling.  Advised for her to do what she felt was best.  Can wear compression sleeve over the ankle for support.  Advised that she will have swelling if she does do this run given the location.   If you have been instructed to have an in-person evaluation today at a local Urgent Care facility, please use the link below. It will take you to a list of all of our available [facility], including address, phone number and hours of operation. Please do not delay care.  [facility]  If you or a family member do not have a primary care provider, use the link below to schedule a visit and establish care. When you choose a [facility] primary care physician or advanced practice provider, you gain a long-term partner in health. Find a Primary Care Provider  Learn more about [facility]'s in-office and virtual care options: [facility] - Get Care Now

## 2024-07-15 NOTE — Progress Notes (Signed)
 Virtual Visit Consent   Amanda [name], you are scheduled for a virtual visit with a [facility] provider today. Just as with appointments in the office, your consent must be obtained to participate. Your consent will be active for this visit and any virtual visit you may have with one of our providers in the next 365 days. If you have a MyChart account, a copy of this consent can be sent to you electronically.  As this is a virtual visit, video technology does not allow for your provider to perform a traditional examination. This may limit your provider's ability to fully assess your condition. If your provider identifies any concerns that need to be evaluated in person or the need to arrange testing (such as labs, EKG, etc.), we will make arrangements to do so. Although advances in technology are sophisticated, we cannot ensure that it will always work on either your end or our end. If the connection with a video visit is poor, the visit may have to be switched to a telephone visit. With either a video or telephone visit, we are not always able to ensure that we have a secure connection.  By engaging in this virtual visit, you consent to the provision of healthcare and authorize for your insurance to be billed (if applicable) for the services provided during this visit. Depending on your insurance coverage, you may receive a charge related to this service.  I need to obtain your verbal consent now. Are you willing to proceed with your visit today? Amanda [name] has provided verbal consent on 07/15/2024 for a virtual visit (video or telephone). Chiquita CHRISTELLA Barefoot, NP  Date: 07/15/2024 12:15 PM   Virtual Visit via Video Note   I, Chiquita CHRISTELLA Barefoot, connected with  Amanda [name]  ([id number], 24-Oct-1981) on 07/15/24 at 12:15 PM EDT by a video-enabled telemedicine application and verified that I am speaking with the correct person using two identifiers.  Location: Patient: Virtual Visit Location Patient:  Home Provider: Virtual Visit Location Provider: Home Office   I discussed the limitations of evaluation and management by telemedicine and the availability of in person appointments. The patient expressed understanding and agreed to proceed.    History of Present Illness: Amanda [name] is a 43 y.o. who identifies as a female who was assigned female at birth, and is being seen today for a yellow jacket sting  Trauma: The accident occurred this morning about an hour and a half ago, [place]  The incident was a being by a yellow jacket to the front right ankle.  There is localized swelling, redness and itching at the site.  Known to have higher reactions to stings and cellulitis with them.  Pain level is a 4 out of 10 at this time.  There is no stinger present.  Reports that she is starting to have some chills.  Denies having joint pain, breathing difficulties, headaches, fevers, chest pain, shortness of breath.   Problems:  Patient Active Problem List   Diagnosis Date Noted   Disordered eating 03/30/2024   History of female infertility 05/27/2023   History of asthma 05/27/2023   History of multiple allergies 05/27/2023   Polyarthralgia 05/27/2023   Pyelonephritis 05/27/2023   Iron  deficiency anemia secondary to inadequate dietary iron  intake 01/14/2022   Iron  deficiency anemia 05/08/2021   Femoral neck stress fracture, initial encounter 03/17/2019   Thoracic outlet syndrome 11/23/2013   Disturbance of skin sensation 11/22/2013  Acute upper respiratory infection 05/26/2008   HYPERLIPIDEMIA 12/04/2006   ANEMIA-NOS 12/04/2006   Migraine headache 12/04/2006   Allergic rhinitis 12/04/2006   Asthma 12/04/2006   Placental abruption 12/04/2006    Allergies:  Allergies  Allergen Reactions   Benzoyl Peroxide Other (See Comments)   Cephalexin Swelling   Codeine    Darvocet A500 [Propoxyphene N-Acetaminophen ]    Dilaudid [Hydromorphone Hcl]     Hydrocodone-Acetaminophen     Latex    Macrobid [Nitrofurantoin Macrocrystal]    Morphine Sulfate    Propoxyphene Other (See Comments)   Sulfa Antibiotics    Tape Rash and Swelling   Medications:  Current Outpatient Medications:    AMBULATORY NON FORMULARY MEDICATION, Floradix liquid Iron  Take 10 ml by mouth twice a day, Disp: , Rfl:   Observations/Objective: Patient is well-developed, well-nourished in no acute distress.  Resting comfortably  at home.  Head is normocephalic, atraumatic.  No labored breathing.  Speech is clear and coherent with logical content.  Patient is alert and oriented at baseline.  There is noted swelling and redness-near the medial malleolus, anterior aspect of ankle foot  Assessment and Plan:  1. Redness (Primary)  - predniSONE  (STERAPRED UNI-PAK 21 TAB) 10 MG (21) TBPK tablet; Take as directed  Dispense: 21 tablet; Refill: 0  2. Swelling  - predniSONE  (STERAPRED UNI-PAK 21 TAB) 10 MG (21) TBPK tablet; Take as directed  Dispense: 21 tablet; Refill: 0  3. Yellow jacket sting, accidental or unintentional, initial encounter  - predniSONE  (STERAPRED UNI-PAK 21 TAB) 10 MG (21) TBPK tablet; Take as directed  Dispense: 21 tablet; Refill: 0 - amoxicillin -clavulanate (AUGMENTIN ) 875-125 MG tablet; Take 1 tablet by mouth 2 (two) times daily for 7 days.  Dispense: 14 tablet; Refill: 0  - Discussion on need of antibiotic versus just use of prednisone .  With known history of having cellulitis in the past will have a prescription for Augmentin  on hand to start in 24-48 hrs. if prednisone  is not improving symptoms  -Ice/cold compresses -Topical and/or oral Benadryl  as needed though prednisone  should help with itching and redness and inflammation -Hydrocortisone if needed -Seek further evaluation if signs of infection do develop and Augmentin  does not improve  -Reports that she is going on a 15 mile run tomorrow.  Advised that yes this could make the injury worse  given the location of the sting.  Hopefully the prednisone  being in her system will prevent any excessive swelling.  Advised for her to do what she felt was best.  Can wear compression sleeve over the ankle for support.  Advised that she will have swelling if she does do this run given the location.   Reviewed side effects, risks and benefits of medication.    Patient acknowledged agreement and understanding of the plan.   Past Medical, Surgical, Social History, Allergies, and Medications have been Reviewed.   Follow Up Instructions: I discussed the assessment and treatment plan with the patient. The patient was provided an opportunity to ask questions and all were answered. The patient agreed with the plan and demonstrated an understanding of the instructions.  A copy of instructions were sent to the patient via MyChart unless otherwise noted below.    The patient was advised to call back or seek an in-person evaluation if the symptoms worsen or if the condition fails to improve as anticipated.    Chiquita CHRISTELLA Barefoot, NP

## 2024-07-16 ENCOUNTER — Encounter: Payer: Self-pay | Admitting: Family Medicine

## 2024-08-02 ENCOUNTER — Inpatient Hospital Stay: Payer: 59 | Attending: Hematology and Oncology

## 2024-08-02 ENCOUNTER — Encounter: Payer: Self-pay | Admitting: Hematology and Oncology

## 2024-08-02 ENCOUNTER — Other Ambulatory Visit: Payer: Self-pay | Admitting: Hematology and Oncology

## 2024-08-02 DIAGNOSIS — D509 Iron deficiency anemia, unspecified: Secondary | ICD-10-CM

## 2024-08-02 LAB — IRON AND IRON BINDING CAPACITY (CC-WL,HP ONLY)
Iron: 116 ug/dL (ref 28–170)
Saturation Ratios: 28 % (ref 10.4–31.8)
TIBC: 412 ug/dL (ref 250–450)
UIBC: 296 ug/dL (ref 148–442)

## 2024-08-02 LAB — CBC WITH DIFFERENTIAL (CANCER CENTER ONLY)
Abs Immature Granulocytes: 0.01 K/uL (ref 0.00–0.07)
Basophils Absolute: 0 K/uL (ref 0.0–0.1)
Basophils Relative: 1 %
Eosinophils Absolute: 0.1 K/uL (ref 0.0–0.5)
Eosinophils Relative: 2 %
HCT: 41.3 % (ref 36.0–46.0)
Hemoglobin: 14 g/dL (ref 12.0–15.0)
Immature Granulocytes: 0 %
Lymphocytes Relative: 23 %
Lymphs Abs: 1.5 K/uL (ref 0.7–4.0)
MCH: 31.3 pg (ref 26.0–34.0)
MCHC: 33.9 g/dL (ref 30.0–36.0)
MCV: 92.4 fL (ref 80.0–100.0)
Monocytes Absolute: 0.4 K/uL (ref 0.1–1.0)
Monocytes Relative: 7 %
Neutro Abs: 4.3 K/uL (ref 1.7–7.7)
Neutrophils Relative %: 67 %
Platelet Count: 217 K/uL (ref 150–400)
RBC: 4.47 MIL/uL (ref 3.87–5.11)
RDW: 12.5 % (ref 11.5–15.5)
WBC Count: 6.4 K/uL (ref 4.0–10.5)
nRBC: 0 % (ref 0.0–0.2)

## 2024-08-02 LAB — CMP (CANCER CENTER ONLY)
ALT: 20 U/L (ref 0–44)
AST: 24 U/L (ref 15–41)
Albumin: 4.7 g/dL (ref 3.5–5.0)
Alkaline Phosphatase: 41 U/L (ref 38–126)
Anion gap: 6 (ref 5–15)
BUN: 17 mg/dL (ref 6–20)
CO2: 28 mmol/L (ref 22–32)
Calcium: 9.5 mg/dL (ref 8.9–10.3)
Chloride: 105 mmol/L (ref 98–111)
Creatinine: 0.73 mg/dL (ref 0.44–1.00)
GFR, Estimated: >60 mL/min (ref >60)
Glucose, Bld: 82 mg/dL (ref 70–99)
Potassium: 4.1 mmol/L (ref 3.5–5.1)
Sodium: 139 mmol/L (ref 135–145)
Total Bilirubin: 0.5 mg/dL (ref 0.0–1.2)
Total Protein: 7.1 g/dL (ref 6.5–8.1)

## 2024-08-02 LAB — FERRITIN: Ferritin: 24 ng/mL (ref 11–307)

## 2024-08-03 ENCOUNTER — Other Ambulatory Visit: Payer: Self-pay | Admitting: Hematology and Oncology

## 2024-08-19 ENCOUNTER — Telehealth: Payer: Self-pay | Admitting: Pharmacy Technician

## 2024-08-19 NOTE — Telephone Encounter (Addendum)
 Auth Submission:denied Site of care: Site of care: [facility] Payer: [employer] Medication & CPT/J Code(s) submitted: Monoferric  (Ferrci derisomaltose) [phone] Diagnosis Code: D50.9 Route of submission (phone, fax, portal): FAXED Phone # Fax # Auth type: Buy/Bill PB Units/visits requested: X1 DOSE Reference number:  Approval from:  to    Monoferric  has been denied. Insurance is requesting patient tried Infed.  Letter has been scanned to media tab.

## 2024-09-15 NOTE — Progress Notes (Signed)
  Subjective:    Patient ID: Amanda [name] is a 43 y.o. female here for a Flu Vaccine and Covid Vaccination visit.     Are you sick today?: No  Have you ever had a serious reaction to any vaccine in the past?: No (FlowSheet update)  Have you felt dizzy or faint before, during or after an immunization?: No (FlowSheet update)  Does the patient agree to be seated in a chair with arms or lie on the exam table?: No Does the patient agree to be observed for 15 minutes post-procedure to assess for reaction or fainting risk?: No Do you have any concerns receiving a vaccine in either arm (history of shoulder injury, mastectomy or other surgery?): No   Any patient-supplied  information was reviewed and discussed with the patient. : Oneil as reviewed.  Has the VIS been reviewed? Please offer a paper copy of the VIS. Patient may decline: Yes (FlowSheet update)      Lifestyle: Keiera reports that she quit smoking about 35 years ago. She does not have any smokeless tobacco history on file.      Objective:    Assessment/Plan:   Vaccine administered in accordance with [facility] guidelines.   Patient advised to contact [employer] if adverse event occurs.

## 2024-10-06 ENCOUNTER — Telehealth: Payer: Self-pay | Admitting: *Deleted

## 2024-10-06 ENCOUNTER — Other Ambulatory Visit: Payer: Self-pay | Admitting: *Deleted

## 2024-10-06 DIAGNOSIS — D509 Iron deficiency anemia, unspecified: Secondary | ICD-10-CM

## 2024-10-06 NOTE — Telephone Encounter (Signed)
 Received call from pt.  She is asking if she can get repeat iron  studies this week. She is still trying to have her insurance approve her Monoferric  but that hasn't happened yet. She would like to see what her lab 3s look like. Able to schedule her for labs tomorrow @ 10:45 am. Orders placed.  Dr. Federico aware. He is working on her appeal letter to her insurance.

## 2024-10-07 ENCOUNTER — Inpatient Hospital Stay: Attending: Hematology and Oncology

## 2024-10-07 DIAGNOSIS — E611 Iron deficiency: Secondary | ICD-10-CM | POA: Insufficient documentation

## 2024-10-07 DIAGNOSIS — D509 Iron deficiency anemia, unspecified: Secondary | ICD-10-CM

## 2024-10-07 LAB — CBC WITH DIFFERENTIAL (CANCER CENTER ONLY)
Abs Immature Granulocytes: 0.02 K/uL (ref 0.00–0.07)
Basophils Absolute: 0 K/uL (ref 0.0–0.1)
Basophils Relative: 1 %
Eosinophils Absolute: 0.1 K/uL (ref 0.0–0.5)
Eosinophils Relative: 2 %
HCT: 40.4 % (ref 36.0–46.0)
Hemoglobin: 13.6 g/dL (ref 12.0–15.0)
Immature Granulocytes: 0 %
Lymphocytes Relative: 28 %
Lymphs Abs: 1.3 K/uL (ref 0.7–4.0)
MCH: 30.9 pg (ref 26.0–34.0)
MCHC: 33.7 g/dL (ref 30.0–36.0)
MCV: 91.8 fL (ref 80.0–100.0)
Monocytes Absolute: 0.4 K/uL (ref 0.1–1.0)
Monocytes Relative: 8 %
Neutro Abs: 2.8 K/uL (ref 1.7–7.7)
Neutrophils Relative %: 61 %
Platelet Count: 233 K/uL (ref 150–400)
RBC: 4.4 MIL/uL (ref 3.87–5.11)
RDW: 13.1 % (ref 11.5–15.5)
WBC Count: 4.6 K/uL (ref 4.0–10.5)
nRBC: 0 % (ref 0.0–0.2)

## 2024-10-07 LAB — IRON AND IRON BINDING CAPACITY (CC-WL,HP ONLY)
Iron: 121 ug/dL (ref 28–170)
Saturation Ratios: 30 % (ref 10.4–31.8)
TIBC: 400 ug/dL (ref 250–450)
UIBC: 279 ug/dL (ref 148–442)

## 2024-10-07 LAB — CMP (CANCER CENTER ONLY)
ALT: 20 U/L (ref 0–44)
AST: 26 U/L (ref 15–41)
Albumin: 4.9 g/dL (ref 3.5–5.0)
Alkaline Phosphatase: 54 U/L (ref 38–126)
Anion gap: 5 (ref 5–15)
BUN: 13 mg/dL (ref 6–20)
CO2: 31 mmol/L (ref 22–32)
Calcium: 10.2 mg/dL (ref 8.9–10.3)
Chloride: 104 mmol/L (ref 98–111)
Creatinine: 0.66 mg/dL (ref 0.44–1.00)
GFR, Estimated: >60 mL/min (ref >60)
Glucose, Bld: 87 mg/dL (ref 70–99)
Potassium: 3.8 mmol/L (ref 3.5–5.1)
Sodium: 140 mmol/L (ref 135–145)
Total Bilirubin: 0.6 mg/dL (ref 0.0–1.2)
Total Protein: 7.6 g/dL (ref 6.5–8.1)

## 2024-10-07 LAB — RETIC PANEL
Immature Retic Fract: 6.8 % (ref 2.3–15.9)
RBC.: 4.44 MIL/uL (ref 3.87–5.11)
Retic Count, Absolute: 49.7 K/uL (ref 19.0–186.0)
Retic Ct Pct: 1.1 % (ref 0.4–3.1)
Reticulocyte Hemoglobin: 34.5 pg (ref >27.9)

## 2024-10-07 LAB — FERRITIN: Ferritin: 20 ng/mL (ref 11–307)

## 2024-10-19 ENCOUNTER — Other Ambulatory Visit: Payer: Self-pay | Admitting: Medical Genetics

## 2024-10-19 DIAGNOSIS — Z006 Encounter for examination for normal comparison and control in clinical research program: Secondary | ICD-10-CM

## 2024-11-16 ENCOUNTER — Telehealth: Payer: Self-pay

## 2024-11-16 LAB — GENECONNECT MOLECULAR SCREEN: Genetic Analysis Overall Interpretation: NEGATIVE

## 2024-11-16 NOTE — Telephone Encounter (Signed)
 Following up on the patient's appeal letter. Current documentation indicates that the provider is actively working on it.

## 2024-11-17 ENCOUNTER — Other Ambulatory Visit: Payer: Self-pay | Admitting: *Deleted

## 2024-11-17 ENCOUNTER — Inpatient Hospital Stay: Attending: Hematology and Oncology

## 2024-11-17 DIAGNOSIS — D509 Iron deficiency anemia, unspecified: Secondary | ICD-10-CM | POA: Insufficient documentation

## 2024-11-17 LAB — IRON AND IRON BINDING CAPACITY (CC-WL,HP ONLY)
Iron: 132 ug/dL (ref 28–170)
Saturation Ratios: 32 % — ABNORMAL HIGH (ref 10.4–31.8)
TIBC: 412 ug/dL (ref 250–450)
UIBC: 280 ug/dL

## 2024-11-17 LAB — CMP (CANCER CENTER ONLY)
ALT: 25 U/L (ref 0–44)
AST: 32 U/L (ref 15–41)
Albumin: 4.6 g/dL (ref 3.5–5.0)
Alkaline Phosphatase: 60 U/L (ref 38–126)
Anion gap: 11 (ref 5–15)
BUN: 18 mg/dL (ref 6–20)
CO2: 27 mmol/L (ref 22–32)
Calcium: 9.5 mg/dL (ref 8.9–10.3)
Chloride: 103 mmol/L (ref 98–111)
Creatinine: 0.64 mg/dL (ref 0.44–1.00)
GFR, Estimated: >60 mL/min (ref >60)
Glucose, Bld: 102 mg/dL — ABNORMAL HIGH (ref 70–99)
Potassium: 3.8 mmol/L (ref 3.5–5.1)
Sodium: 140 mmol/L (ref 135–145)
Total Bilirubin: 0.4 mg/dL (ref 0.0–1.2)
Total Protein: 6.9 g/dL (ref 6.5–8.1)

## 2024-11-17 LAB — FERRITIN: Ferritin: 17 ng/mL (ref 11–307)

## 2024-11-17 LAB — CBC WITH DIFFERENTIAL (CANCER CENTER ONLY)
Abs Immature Granulocytes: 0.01 K/uL (ref 0.00–0.07)
Basophils Absolute: 0 K/uL (ref 0.0–0.1)
Basophils Relative: 1 %
Eosinophils Absolute: 0.1 K/uL (ref 0.0–0.5)
Eosinophils Relative: 2 %
HCT: 38.9 % (ref 36.0–46.0)
Hemoglobin: 13.2 g/dL (ref 12.0–15.0)
Immature Granulocytes: 0 %
Lymphocytes Relative: 29 %
Lymphs Abs: 1.6 K/uL (ref 0.7–4.0)
MCH: 30.9 pg (ref 26.0–34.0)
MCHC: 33.9 g/dL (ref 30.0–36.0)
MCV: 91.1 fL (ref 80.0–100.0)
Monocytes Absolute: 0.4 K/uL (ref 0.1–1.0)
Monocytes Relative: 7 %
Neutro Abs: 3.2 K/uL (ref 1.7–7.7)
Neutrophils Relative %: 61 %
Platelet Count: 250 K/uL (ref 150–400)
RBC: 4.27 MIL/uL (ref 3.87–5.11)
RDW: 13.3 % (ref 11.5–15.5)
WBC Count: 5.3 K/uL (ref 4.0–10.5)
nRBC: 0 % (ref 0.0–0.2)

## 2024-11-17 LAB — RETIC PANEL
Immature Retic Fract: 5.3 % (ref 2.3–15.9)
RBC.: 4.13 MIL/uL (ref 3.87–5.11)
Retic Count, Absolute: 45.4 K/uL (ref 19.0–186.0)
Retic Ct Pct: 1.1 % (ref 0.4–3.1)
Reticulocyte Hemoglobin: 33.7 pg (ref >27.9)

## 2025-01-10 ENCOUNTER — Telehealth: Payer: Self-pay | Admitting: *Deleted

## 2025-01-10 ENCOUNTER — Other Ambulatory Visit: Payer: Self-pay | Admitting: *Deleted

## 2025-01-10 DIAGNOSIS — D509 Iron deficiency anemia, unspecified: Secondary | ICD-10-CM

## 2025-01-10 NOTE — Telephone Encounter (Signed)
 Received call from pt. She is asking for repeat iron  deficiency labs and then, depending on those results, have Dr. Federico order Monoferric  iron  infusion. She thinks it is likely to be denied by her insurance company and that we will have to appeal that decision. Advised that we had done that last year without much success. Pt agrees but is willing to try again. The type of iv iron  her insurance company wants is not given in any of our infusion centers. Advised we will work with her and her insurance company, working towards a better outcome. Advised we will need to repeat her labs prior to ordering the Monoferric . She is agreeable to coming in Wednesday, 01/12/25 @ 3pm.  Appt made, lab orders placed. Dr. Federico made aware.

## 2025-01-12 ENCOUNTER — Inpatient Hospital Stay: Attending: Hematology and Oncology

## 2025-01-12 DIAGNOSIS — D509 Iron deficiency anemia, unspecified: Secondary | ICD-10-CM

## 2025-01-12 LAB — IRON AND IRON BINDING CAPACITY (CC-WL,HP ONLY)
Iron: 61 ug/dL (ref 28–170)
Saturation Ratios: 15 % (ref 10.4–31.8)
TIBC: 413 ug/dL (ref 250–450)
UIBC: 352 ug/dL

## 2025-01-12 LAB — CMP (CANCER CENTER ONLY)
ALT: 19 U/L (ref 0–44)
AST: 28 U/L (ref 15–41)
Albumin: 4.4 g/dL (ref 3.5–5.0)
Alkaline Phosphatase: 54 U/L (ref 38–126)
Anion gap: 12 (ref 5–15)
BUN: 17 mg/dL (ref 6–20)
CO2: 25 mmol/L (ref 22–32)
Calcium: 9 mg/dL (ref 8.9–10.3)
Chloride: 102 mmol/L (ref 98–111)
Creatinine: 0.61 mg/dL (ref 0.44–1.00)
GFR, Estimated: >60 mL/min
Glucose, Bld: 98 mg/dL (ref 70–99)
Potassium: 3.6 mmol/L (ref 3.5–5.1)
Sodium: 140 mmol/L (ref 135–145)
Total Bilirubin: 0.3 mg/dL (ref 0.0–1.2)
Total Protein: 6.6 g/dL (ref 6.5–8.1)

## 2025-01-12 LAB — CBC WITH DIFFERENTIAL (CANCER CENTER ONLY)
Abs Immature Granulocytes: 0.01 K/uL (ref 0.00–0.07)
Basophils Absolute: 0.1 K/uL (ref 0.0–0.1)
Basophils Relative: 1 %
Eosinophils Absolute: 0.1 K/uL (ref 0.0–0.5)
Eosinophils Relative: 2 %
HCT: 34.8 % — ABNORMAL LOW (ref 36.0–46.0)
Hemoglobin: 11.7 g/dL — ABNORMAL LOW (ref 12.0–15.0)
Immature Granulocytes: 0 %
Lymphocytes Relative: 23 %
Lymphs Abs: 1.5 K/uL (ref 0.7–4.0)
MCH: 30.2 pg (ref 26.0–34.0)
MCHC: 33.6 g/dL (ref 30.0–36.0)
MCV: 89.9 fL (ref 80.0–100.0)
Monocytes Absolute: 0.5 K/uL (ref 0.1–1.0)
Monocytes Relative: 8 %
Neutro Abs: 4.2 K/uL (ref 1.7–7.7)
Neutrophils Relative %: 66 %
Platelet Count: 281 K/uL (ref 150–400)
RBC: 3.87 MIL/uL (ref 3.87–5.11)
RDW: 13.3 % (ref 11.5–15.5)
WBC Count: 6.3 K/uL (ref 4.0–10.5)
nRBC: 0 % (ref 0.0–0.2)

## 2025-01-12 LAB — RETIC PANEL
Immature Retic Fract: 7.8 % (ref 2.3–15.9)
RBC.: 3.81 MIL/uL — ABNORMAL LOW (ref 3.87–5.11)
Retic Count, Absolute: 44.2 K/uL (ref 19.0–186.0)
Retic Ct Pct: 1.2 % (ref 0.4–3.1)
Reticulocyte Hemoglobin: 32.9 pg

## 2025-01-12 LAB — FERRITIN: Ferritin: 13 ng/mL (ref 11–307)

## 2025-01-14 ENCOUNTER — Other Ambulatory Visit: Payer: Self-pay | Admitting: Hematology and Oncology

## 2025-01-17 ENCOUNTER — Telehealth (HOSPITAL_COMMUNITY): Payer: Self-pay | Admitting: Pharmacy Technician

## 2025-01-17 ENCOUNTER — Encounter (HOSPITAL_COMMUNITY): Payer: Self-pay | Admitting: Hematology and Oncology

## 2025-01-17 NOTE — Telephone Encounter (Signed)
 Dr. Federico,  Monoferric  was denied by insurance since patient has not tried and failed Infed. Venofer  is a preferred med also and no auth needed. I saw that patient prefers Monoferric  and wanted the provider to submit an appeal. The denial has been scanned into the media tab for you to review.     Auth Submission: DENIED Site of care: [facility] Payer: [employer] Medication & CPT/J Code(s) submitted: Monoferric  (Ferric derisomaltose ) [id number] Diagnosis Code: D50.9 Route of submission (phone, fax, portal): AVAILITY Phone # Fax # Auth type: Buy/Bill HB Units/visits requested: 1000 Reference number: [id number]  1/22- spoke to nurse at [facility], answered clinical questions and she will send to a pharmacist for secondary review.   01/24/25- Received denial from [employer]. Patient must have tried and failed Infed before Monoferric  would be approved. (Venofer  is also preferred and does not need authorization)   Dagoberto Armour, CPhT [facility] Phone: [phone] 01/17/2025
